# Patient Record
Sex: Male | Born: 1935 | Race: Black or African American | Hispanic: No
Health system: Southern US, Community
[De-identification: ages and names within clinical notes are randomized; demographics above are authoritative.]

## PROBLEM LIST (undated history)

## (undated) DIAGNOSIS — F32A Depression, unspecified: Secondary | ICD-10-CM

## (undated) DIAGNOSIS — F329 Major depressive disorder, single episode, unspecified: Secondary | ICD-10-CM

## (undated) DIAGNOSIS — E7800 Pure hypercholesterolemia, unspecified: Secondary | ICD-10-CM

## (undated) DIAGNOSIS — E119 Type 2 diabetes mellitus without complications: Secondary | ICD-10-CM

## (undated) DIAGNOSIS — F0390 Unspecified dementia without behavioral disturbance: Secondary | ICD-10-CM

## (undated) DIAGNOSIS — I10 Essential (primary) hypertension: Secondary | ICD-10-CM

## (undated) DIAGNOSIS — I4891 Unspecified atrial fibrillation: Secondary | ICD-10-CM

## (undated) DIAGNOSIS — Z8673 Personal history of transient ischemic attack (TIA), and cerebral infarction without residual deficits: Secondary | ICD-10-CM

## (undated) DIAGNOSIS — I639 Cerebral infarction, unspecified: Secondary | ICD-10-CM

## (undated) HISTORY — DX: Essential (primary) hypertension: I10

## (undated) HISTORY — DX: Unspecified atrial fibrillation: I48.91

## (undated) HISTORY — DX: Pure hypercholesterolemia, unspecified: E78.00

## (undated) HISTORY — DX: Depression, unspecified: F32.A

## (undated) HISTORY — DX: Type 2 diabetes mellitus without complications: E11.9

## (undated) HISTORY — DX: Unspecified dementia, unspecified severity, without behavioral disturbance, psychotic disturbance, mood disturbance, and anxiety: F03.90

## (undated) HISTORY — DX: Personal history of transient ischemic attack (TIA), and cerebral infarction without residual deficits: Z86.73

## (undated) HISTORY — DX: Major depressive disorder, single episode, unspecified: F32.9

## (undated) HISTORY — PX: PACEMAKER INSERTION: SHX728

## (undated) HISTORY — DX: Cerebral infarction, unspecified: I63.9

## (undated) HISTORY — PX: APPENDECTOMY: SHX54

---

## 2011-06-21 DIAGNOSIS — I495 Sick sinus syndrome: Secondary | ICD-10-CM | POA: Diagnosis not present

## 2011-06-21 DIAGNOSIS — Z950 Presence of cardiac pacemaker: Secondary | ICD-10-CM | POA: Diagnosis not present

## 2011-07-13 DIAGNOSIS — F519 Sleep disorder not due to a substance or known physiological condition, unspecified: Secondary | ICD-10-CM | POA: Diagnosis not present

## 2011-07-13 DIAGNOSIS — I739 Peripheral vascular disease, unspecified: Secondary | ICD-10-CM | POA: Diagnosis not present

## 2011-07-13 DIAGNOSIS — G619 Inflammatory polyneuropathy, unspecified: Secondary | ICD-10-CM | POA: Diagnosis not present

## 2011-07-13 DIAGNOSIS — E230 Hypopituitarism: Secondary | ICD-10-CM | POA: Diagnosis not present

## 2011-07-17 DIAGNOSIS — E1149 Type 2 diabetes mellitus with other diabetic neurological complication: Secondary | ICD-10-CM | POA: Diagnosis not present

## 2011-07-18 DIAGNOSIS — I495 Sick sinus syndrome: Secondary | ICD-10-CM | POA: Diagnosis not present

## 2011-07-18 DIAGNOSIS — Z950 Presence of cardiac pacemaker: Secondary | ICD-10-CM | POA: Diagnosis not present

## 2011-07-26 DIAGNOSIS — N19 Unspecified kidney failure: Secondary | ICD-10-CM | POA: Diagnosis not present

## 2011-07-26 DIAGNOSIS — E119 Type 2 diabetes mellitus without complications: Secondary | ICD-10-CM | POA: Diagnosis not present

## 2011-07-27 DIAGNOSIS — F519 Sleep disorder not due to a substance or known physiological condition, unspecified: Secondary | ICD-10-CM | POA: Diagnosis not present

## 2011-07-27 DIAGNOSIS — G2581 Restless legs syndrome: Secondary | ICD-10-CM | POA: Diagnosis not present

## 2011-07-31 DIAGNOSIS — B9789 Other viral agents as the cause of diseases classified elsewhere: Secondary | ICD-10-CM | POA: Diagnosis not present

## 2011-08-07 DIAGNOSIS — B9789 Other viral agents as the cause of diseases classified elsewhere: Secondary | ICD-10-CM | POA: Diagnosis not present

## 2011-08-15 DIAGNOSIS — Z950 Presence of cardiac pacemaker: Secondary | ICD-10-CM | POA: Diagnosis not present

## 2011-08-15 DIAGNOSIS — I495 Sick sinus syndrome: Secondary | ICD-10-CM | POA: Diagnosis not present

## 2011-08-17 DIAGNOSIS — G622 Polyneuropathy due to other toxic agents: Secondary | ICD-10-CM | POA: Diagnosis not present

## 2011-08-17 DIAGNOSIS — I739 Peripheral vascular disease, unspecified: Secondary | ICD-10-CM | POA: Diagnosis not present

## 2011-08-17 DIAGNOSIS — E230 Hypopituitarism: Secondary | ICD-10-CM | POA: Diagnosis not present

## 2011-08-17 DIAGNOSIS — G619 Inflammatory polyneuropathy, unspecified: Secondary | ICD-10-CM | POA: Diagnosis not present

## 2011-08-17 DIAGNOSIS — F519 Sleep disorder not due to a substance or known physiological condition, unspecified: Secondary | ICD-10-CM | POA: Diagnosis not present

## 2011-08-19 DIAGNOSIS — N19 Unspecified kidney failure: Secondary | ICD-10-CM | POA: Diagnosis not present

## 2011-08-21 DIAGNOSIS — B9789 Other viral agents as the cause of diseases classified elsewhere: Secondary | ICD-10-CM | POA: Diagnosis not present

## 2011-08-22 DIAGNOSIS — F519 Sleep disorder not due to a substance or known physiological condition, unspecified: Secondary | ICD-10-CM | POA: Diagnosis not present

## 2011-08-22 DIAGNOSIS — G2581 Restless legs syndrome: Secondary | ICD-10-CM | POA: Diagnosis not present

## 2011-08-29 DIAGNOSIS — I079 Rheumatic tricuspid valve disease, unspecified: Secondary | ICD-10-CM | POA: Diagnosis not present

## 2011-08-29 DIAGNOSIS — I499 Cardiac arrhythmia, unspecified: Secondary | ICD-10-CM | POA: Diagnosis not present

## 2011-08-29 DIAGNOSIS — R011 Cardiac murmur, unspecified: Secondary | ICD-10-CM | POA: Diagnosis not present

## 2011-09-01 DIAGNOSIS — B9789 Other viral agents as the cause of diseases classified elsewhere: Secondary | ICD-10-CM | POA: Diagnosis not present

## 2011-09-04 DIAGNOSIS — B9689 Other specified bacterial agents as the cause of diseases classified elsewhere: Secondary | ICD-10-CM | POA: Diagnosis not present

## 2011-09-04 DIAGNOSIS — N390 Urinary tract infection, site not specified: Secondary | ICD-10-CM | POA: Diagnosis not present

## 2011-09-04 DIAGNOSIS — A499 Bacterial infection, unspecified: Secondary | ICD-10-CM | POA: Diagnosis not present

## 2011-09-12 DIAGNOSIS — I495 Sick sinus syndrome: Secondary | ICD-10-CM | POA: Diagnosis not present

## 2011-09-12 DIAGNOSIS — Z950 Presence of cardiac pacemaker: Secondary | ICD-10-CM | POA: Diagnosis not present

## 2011-09-18 DIAGNOSIS — E1149 Type 2 diabetes mellitus with other diabetic neurological complication: Secondary | ICD-10-CM | POA: Diagnosis not present

## 2011-09-20 DIAGNOSIS — G622 Polyneuropathy due to other toxic agents: Secondary | ICD-10-CM | POA: Diagnosis not present

## 2011-09-20 DIAGNOSIS — F519 Sleep disorder not due to a substance or known physiological condition, unspecified: Secondary | ICD-10-CM | POA: Diagnosis not present

## 2011-09-20 DIAGNOSIS — I739 Peripheral vascular disease, unspecified: Secondary | ICD-10-CM | POA: Diagnosis not present

## 2011-09-20 DIAGNOSIS — G619 Inflammatory polyneuropathy, unspecified: Secondary | ICD-10-CM | POA: Diagnosis not present

## 2011-09-20 DIAGNOSIS — E230 Hypopituitarism: Secondary | ICD-10-CM | POA: Diagnosis not present

## 2011-09-21 DIAGNOSIS — B9789 Other viral agents as the cause of diseases classified elsewhere: Secondary | ICD-10-CM | POA: Diagnosis not present

## 2011-09-27 DIAGNOSIS — B9789 Other viral agents as the cause of diseases classified elsewhere: Secondary | ICD-10-CM | POA: Diagnosis not present

## 2011-09-27 DIAGNOSIS — M5414 Radiculopathy, thoracic region: Secondary | ICD-10-CM | POA: Diagnosis not present

## 2011-10-10 DIAGNOSIS — I495 Sick sinus syndrome: Secondary | ICD-10-CM | POA: Diagnosis not present

## 2011-10-10 DIAGNOSIS — Z950 Presence of cardiac pacemaker: Secondary | ICD-10-CM | POA: Diagnosis not present

## 2011-10-15 DIAGNOSIS — R0789 Other chest pain: Secondary | ICD-10-CM | POA: Diagnosis not present

## 2011-10-15 DIAGNOSIS — J90 Pleural effusion, not elsewhere classified: Secondary | ICD-10-CM | POA: Diagnosis not present

## 2011-10-15 DIAGNOSIS — N289 Disorder of kidney and ureter, unspecified: Secondary | ICD-10-CM | POA: Diagnosis present

## 2011-10-15 DIAGNOSIS — N644 Mastodynia: Secondary | ICD-10-CM | POA: Diagnosis not present

## 2011-10-15 DIAGNOSIS — I82409 Acute embolism and thrombosis of unspecified deep veins of unspecified lower extremity: Secondary | ICD-10-CM | POA: Diagnosis not present

## 2011-10-15 DIAGNOSIS — E119 Type 2 diabetes mellitus without complications: Secondary | ICD-10-CM | POA: Diagnosis not present

## 2011-10-15 DIAGNOSIS — I10 Essential (primary) hypertension: Secondary | ICD-10-CM | POA: Diagnosis not present

## 2011-10-15 DIAGNOSIS — R0602 Shortness of breath: Secondary | ICD-10-CM | POA: Diagnosis not present

## 2011-10-15 DIAGNOSIS — I4891 Unspecified atrial fibrillation: Secondary | ICD-10-CM | POA: Diagnosis not present

## 2011-10-15 DIAGNOSIS — Z950 Presence of cardiac pacemaker: Secondary | ICD-10-CM | POA: Diagnosis not present

## 2011-10-15 DIAGNOSIS — L98499 Non-pressure chronic ulcer of skin of other sites with unspecified severity: Secondary | ICD-10-CM | POA: Diagnosis not present

## 2011-10-15 DIAGNOSIS — R079 Chest pain, unspecified: Secondary | ICD-10-CM | POA: Diagnosis not present

## 2011-10-15 DIAGNOSIS — R071 Chest pain on breathing: Secondary | ICD-10-CM | POA: Diagnosis not present

## 2011-10-15 DIAGNOSIS — R918 Other nonspecific abnormal finding of lung field: Secondary | ICD-10-CM | POA: Diagnosis not present

## 2011-10-15 DIAGNOSIS — E785 Hyperlipidemia, unspecified: Secondary | ICD-10-CM | POA: Diagnosis not present

## 2011-10-15 DIAGNOSIS — Z8679 Personal history of other diseases of the circulatory system: Secondary | ICD-10-CM | POA: Diagnosis not present

## 2011-10-19 DIAGNOSIS — I739 Peripheral vascular disease, unspecified: Secondary | ICD-10-CM | POA: Diagnosis not present

## 2011-10-19 DIAGNOSIS — F519 Sleep disorder not due to a substance or known physiological condition, unspecified: Secondary | ICD-10-CM | POA: Diagnosis not present

## 2011-10-19 DIAGNOSIS — E230 Hypopituitarism: Secondary | ICD-10-CM | POA: Diagnosis not present

## 2011-10-19 DIAGNOSIS — G622 Polyneuropathy due to other toxic agents: Secondary | ICD-10-CM | POA: Diagnosis not present

## 2011-10-19 DIAGNOSIS — G619 Inflammatory polyneuropathy, unspecified: Secondary | ICD-10-CM | POA: Diagnosis not present

## 2011-10-19 DIAGNOSIS — N398 Other specified disorders of urinary system: Secondary | ICD-10-CM | POA: Diagnosis not present

## 2011-10-20 DIAGNOSIS — N289 Disorder of kidney and ureter, unspecified: Secondary | ICD-10-CM | POA: Diagnosis not present

## 2011-10-20 DIAGNOSIS — E860 Dehydration: Secondary | ICD-10-CM | POA: Diagnosis not present

## 2011-10-23 DIAGNOSIS — G2581 Restless legs syndrome: Secondary | ICD-10-CM | POA: Diagnosis not present

## 2011-10-23 DIAGNOSIS — E230 Hypopituitarism: Secondary | ICD-10-CM | POA: Diagnosis not present

## 2011-10-23 DIAGNOSIS — G4700 Insomnia, unspecified: Secondary | ICD-10-CM | POA: Diagnosis not present

## 2011-10-23 DIAGNOSIS — F519 Sleep disorder not due to a substance or known physiological condition, unspecified: Secondary | ICD-10-CM | POA: Diagnosis not present

## 2011-10-24 DIAGNOSIS — I319 Disease of pericardium, unspecified: Secondary | ICD-10-CM | POA: Diagnosis not present

## 2011-10-27 DIAGNOSIS — B9789 Other viral agents as the cause of diseases classified elsewhere: Secondary | ICD-10-CM | POA: Diagnosis not present

## 2011-11-06 DIAGNOSIS — I739 Peripheral vascular disease, unspecified: Secondary | ICD-10-CM | POA: Diagnosis not present

## 2011-11-06 DIAGNOSIS — F519 Sleep disorder not due to a substance or known physiological condition, unspecified: Secondary | ICD-10-CM | POA: Diagnosis not present

## 2011-11-06 DIAGNOSIS — E230 Hypopituitarism: Secondary | ICD-10-CM | POA: Diagnosis not present

## 2011-11-06 DIAGNOSIS — G619 Inflammatory polyneuropathy, unspecified: Secondary | ICD-10-CM | POA: Diagnosis not present

## 2011-11-06 DIAGNOSIS — G622 Polyneuropathy due to other toxic agents: Secondary | ICD-10-CM | POA: Diagnosis not present

## 2011-11-15 DIAGNOSIS — B9789 Other viral agents as the cause of diseases classified elsewhere: Secondary | ICD-10-CM | POA: Diagnosis not present

## 2011-11-17 DIAGNOSIS — I495 Sick sinus syndrome: Secondary | ICD-10-CM | POA: Diagnosis not present

## 2011-11-17 DIAGNOSIS — Z950 Presence of cardiac pacemaker: Secondary | ICD-10-CM | POA: Diagnosis not present

## 2011-11-21 DIAGNOSIS — B9789 Other viral agents as the cause of diseases classified elsewhere: Secondary | ICD-10-CM | POA: Diagnosis not present

## 2011-11-27 DIAGNOSIS — D649 Anemia, unspecified: Secondary | ICD-10-CM | POA: Diagnosis not present

## 2011-11-27 DIAGNOSIS — N400 Enlarged prostate without lower urinary tract symptoms: Secondary | ICD-10-CM | POA: Diagnosis not present

## 2011-12-04 DIAGNOSIS — G619 Inflammatory polyneuropathy, unspecified: Secondary | ICD-10-CM | POA: Diagnosis not present

## 2011-12-04 DIAGNOSIS — G622 Polyneuropathy due to other toxic agents: Secondary | ICD-10-CM | POA: Diagnosis not present

## 2011-12-04 DIAGNOSIS — F519 Sleep disorder not due to a substance or known physiological condition, unspecified: Secondary | ICD-10-CM | POA: Diagnosis not present

## 2011-12-04 DIAGNOSIS — E230 Hypopituitarism: Secondary | ICD-10-CM | POA: Diagnosis not present

## 2011-12-04 DIAGNOSIS — I739 Peripheral vascular disease, unspecified: Secondary | ICD-10-CM | POA: Diagnosis not present

## 2011-12-05 DIAGNOSIS — B9789 Other viral agents as the cause of diseases classified elsewhere: Secondary | ICD-10-CM | POA: Diagnosis not present

## 2011-12-12 DIAGNOSIS — Z950 Presence of cardiac pacemaker: Secondary | ICD-10-CM | POA: Diagnosis not present

## 2011-12-12 DIAGNOSIS — I495 Sick sinus syndrome: Secondary | ICD-10-CM | POA: Diagnosis not present

## 2011-12-20 DIAGNOSIS — B9789 Other viral agents as the cause of diseases classified elsewhere: Secondary | ICD-10-CM | POA: Diagnosis not present

## 2012-01-04 DIAGNOSIS — I129 Hypertensive chronic kidney disease with stage 1 through stage 4 chronic kidney disease, or unspecified chronic kidney disease: Secondary | ICD-10-CM | POA: Diagnosis not present

## 2012-01-04 DIAGNOSIS — G6189 Other inflammatory polyneuropathies: Secondary | ICD-10-CM | POA: Diagnosis not present

## 2012-01-04 DIAGNOSIS — B9789 Other viral agents as the cause of diseases classified elsewhere: Secondary | ICD-10-CM | POA: Diagnosis not present

## 2012-01-04 DIAGNOSIS — T5694XA Toxic effect of unspecified metal, undetermined, initial encounter: Secondary | ICD-10-CM | POA: Diagnosis not present

## 2012-01-04 DIAGNOSIS — G622 Polyneuropathy due to other toxic agents: Secondary | ICD-10-CM | POA: Diagnosis not present

## 2012-01-09 DIAGNOSIS — Z950 Presence of cardiac pacemaker: Secondary | ICD-10-CM | POA: Diagnosis not present

## 2012-01-09 DIAGNOSIS — I495 Sick sinus syndrome: Secondary | ICD-10-CM | POA: Diagnosis not present

## 2012-01-22 DIAGNOSIS — E1149 Type 2 diabetes mellitus with other diabetic neurological complication: Secondary | ICD-10-CM | POA: Diagnosis not present

## 2012-02-06 DIAGNOSIS — Z950 Presence of cardiac pacemaker: Secondary | ICD-10-CM | POA: Diagnosis not present

## 2012-02-06 DIAGNOSIS — I495 Sick sinus syndrome: Secondary | ICD-10-CM | POA: Diagnosis not present

## 2012-02-09 DIAGNOSIS — B9789 Other viral agents as the cause of diseases classified elsewhere: Secondary | ICD-10-CM | POA: Diagnosis not present

## 2012-03-05 DIAGNOSIS — I495 Sick sinus syndrome: Secondary | ICD-10-CM | POA: Diagnosis not present

## 2012-04-02 DIAGNOSIS — I495 Sick sinus syndrome: Secondary | ICD-10-CM | POA: Diagnosis not present

## 2012-05-02 DIAGNOSIS — B9789 Other viral agents as the cause of diseases classified elsewhere: Secondary | ICD-10-CM | POA: Diagnosis not present

## 2012-05-07 DIAGNOSIS — I495 Sick sinus syndrome: Secondary | ICD-10-CM | POA: Diagnosis not present

## 2012-05-07 DIAGNOSIS — Z950 Presence of cardiac pacemaker: Secondary | ICD-10-CM | POA: Diagnosis not present

## 2012-05-08 DIAGNOSIS — B9789 Other viral agents as the cause of diseases classified elsewhere: Secondary | ICD-10-CM | POA: Diagnosis not present

## 2012-05-09 DIAGNOSIS — E1149 Type 2 diabetes mellitus with other diabetic neurological complication: Secondary | ICD-10-CM | POA: Diagnosis not present

## 2012-05-21 DIAGNOSIS — IMO0002 Reserved for concepts with insufficient information to code with codable children: Secondary | ICD-10-CM | POA: Diagnosis not present

## 2012-05-21 DIAGNOSIS — G4730 Sleep apnea, unspecified: Secondary | ICD-10-CM | POA: Diagnosis not present

## 2012-05-21 DIAGNOSIS — M531 Cervicobrachial syndrome: Secondary | ICD-10-CM | POA: Diagnosis not present

## 2012-05-22 DIAGNOSIS — B9789 Other viral agents as the cause of diseases classified elsewhere: Secondary | ICD-10-CM | POA: Diagnosis not present

## 2012-05-22 DIAGNOSIS — C61 Malignant neoplasm of prostate: Secondary | ICD-10-CM | POA: Diagnosis not present

## 2012-05-22 DIAGNOSIS — K909 Intestinal malabsorption, unspecified: Secondary | ICD-10-CM | POA: Diagnosis not present

## 2012-05-22 DIAGNOSIS — D649 Anemia, unspecified: Secondary | ICD-10-CM | POA: Diagnosis not present

## 2012-05-22 DIAGNOSIS — E785 Hyperlipidemia, unspecified: Secondary | ICD-10-CM | POA: Diagnosis not present

## 2012-05-22 DIAGNOSIS — G548 Other nerve root and plexus disorders: Secondary | ICD-10-CM | POA: Diagnosis not present

## 2012-05-22 DIAGNOSIS — E230 Hypopituitarism: Secondary | ICD-10-CM | POA: Diagnosis not present

## 2012-05-31 DIAGNOSIS — Z950 Presence of cardiac pacemaker: Secondary | ICD-10-CM | POA: Diagnosis not present

## 2012-05-31 DIAGNOSIS — I4891 Unspecified atrial fibrillation: Secondary | ICD-10-CM | POA: Diagnosis not present

## 2012-05-31 DIAGNOSIS — I4892 Unspecified atrial flutter: Secondary | ICD-10-CM | POA: Diagnosis not present

## 2012-05-31 DIAGNOSIS — R002 Palpitations: Secondary | ICD-10-CM | POA: Diagnosis not present

## 2012-06-05 DIAGNOSIS — IMO0002 Reserved for concepts with insufficient information to code with codable children: Secondary | ICD-10-CM | POA: Diagnosis not present

## 2012-06-05 DIAGNOSIS — M531 Cervicobrachial syndrome: Secondary | ICD-10-CM | POA: Diagnosis not present

## 2012-06-05 DIAGNOSIS — B9789 Other viral agents as the cause of diseases classified elsewhere: Secondary | ICD-10-CM | POA: Diagnosis not present

## 2012-06-05 DIAGNOSIS — G4730 Sleep apnea, unspecified: Secondary | ICD-10-CM | POA: Diagnosis not present

## 2012-06-05 DIAGNOSIS — F411 Generalized anxiety disorder: Secondary | ICD-10-CM | POA: Diagnosis not present

## 2012-06-06 DIAGNOSIS — G548 Other nerve root and plexus disorders: Secondary | ICD-10-CM | POA: Diagnosis not present

## 2012-06-06 DIAGNOSIS — I509 Heart failure, unspecified: Secondary | ICD-10-CM | POA: Diagnosis not present

## 2012-06-10 DIAGNOSIS — G250 Essential tremor: Secondary | ICD-10-CM | POA: Diagnosis not present

## 2012-06-10 DIAGNOSIS — G4730 Sleep apnea, unspecified: Secondary | ICD-10-CM | POA: Diagnosis not present

## 2012-06-10 DIAGNOSIS — IMO0001 Reserved for inherently not codable concepts without codable children: Secondary | ICD-10-CM | POA: Diagnosis not present

## 2012-06-10 DIAGNOSIS — G548 Other nerve root and plexus disorders: Secondary | ICD-10-CM | POA: Diagnosis not present

## 2012-06-10 DIAGNOSIS — G2581 Restless legs syndrome: Secondary | ICD-10-CM | POA: Diagnosis not present

## 2012-06-10 DIAGNOSIS — G252 Other specified forms of tremor: Secondary | ICD-10-CM | POA: Diagnosis not present

## 2012-06-11 DIAGNOSIS — I110 Hypertensive heart disease with heart failure: Secondary | ICD-10-CM | POA: Diagnosis not present

## 2012-06-11 DIAGNOSIS — I4460 Unspecified fascicular block: Secondary | ICD-10-CM | POA: Diagnosis not present

## 2012-06-11 DIAGNOSIS — I4892 Unspecified atrial flutter: Secondary | ICD-10-CM | POA: Diagnosis not present

## 2012-06-11 DIAGNOSIS — I4891 Unspecified atrial fibrillation: Secondary | ICD-10-CM | POA: Diagnosis not present

## 2012-06-11 DIAGNOSIS — R0602 Shortness of breath: Secondary | ICD-10-CM | POA: Diagnosis not present

## 2012-06-11 DIAGNOSIS — I10 Essential (primary) hypertension: Secondary | ICD-10-CM | POA: Diagnosis not present

## 2012-06-11 DIAGNOSIS — I509 Heart failure, unspecified: Secondary | ICD-10-CM | POA: Diagnosis not present

## 2012-06-17 DIAGNOSIS — B9789 Other viral agents as the cause of diseases classified elsewhere: Secondary | ICD-10-CM | POA: Diagnosis not present

## 2012-06-18 DIAGNOSIS — M5430 Sciatica, unspecified side: Secondary | ICD-10-CM | POA: Diagnosis not present

## 2012-06-20 DIAGNOSIS — I10 Essential (primary) hypertension: Secondary | ICD-10-CM | POA: Diagnosis not present

## 2012-06-20 DIAGNOSIS — I4891 Unspecified atrial fibrillation: Secondary | ICD-10-CM | POA: Diagnosis not present

## 2012-06-20 DIAGNOSIS — I495 Sick sinus syndrome: Secondary | ICD-10-CM | POA: Diagnosis not present

## 2012-06-20 DIAGNOSIS — Z950 Presence of cardiac pacemaker: Secondary | ICD-10-CM | POA: Diagnosis not present

## 2012-06-25 DIAGNOSIS — I495 Sick sinus syndrome: Secondary | ICD-10-CM | POA: Diagnosis not present

## 2012-06-27 DIAGNOSIS — B9789 Other viral agents as the cause of diseases classified elsewhere: Secondary | ICD-10-CM | POA: Diagnosis not present

## 2012-06-28 DIAGNOSIS — M5430 Sciatica, unspecified side: Secondary | ICD-10-CM | POA: Diagnosis not present

## 2012-07-03 DIAGNOSIS — B9789 Other viral agents as the cause of diseases classified elsewhere: Secondary | ICD-10-CM | POA: Diagnosis not present

## 2012-07-03 DIAGNOSIS — G548 Other nerve root and plexus disorders: Secondary | ICD-10-CM | POA: Diagnosis not present

## 2012-07-04 DIAGNOSIS — I499 Cardiac arrhythmia, unspecified: Secondary | ICD-10-CM | POA: Diagnosis not present

## 2012-07-04 DIAGNOSIS — I10 Essential (primary) hypertension: Secondary | ICD-10-CM | POA: Diagnosis not present

## 2012-07-04 DIAGNOSIS — I4891 Unspecified atrial fibrillation: Secondary | ICD-10-CM | POA: Diagnosis not present

## 2012-07-11 DIAGNOSIS — E1149 Type 2 diabetes mellitus with other diabetic neurological complication: Secondary | ICD-10-CM | POA: Diagnosis not present

## 2012-07-17 DIAGNOSIS — B9789 Other viral agents as the cause of diseases classified elsewhere: Secondary | ICD-10-CM | POA: Diagnosis not present

## 2012-07-18 DIAGNOSIS — M5430 Sciatica, unspecified side: Secondary | ICD-10-CM | POA: Diagnosis not present

## 2012-07-18 DIAGNOSIS — E782 Mixed hyperlipidemia: Secondary | ICD-10-CM | POA: Diagnosis not present

## 2012-07-18 DIAGNOSIS — Z950 Presence of cardiac pacemaker: Secondary | ICD-10-CM | POA: Diagnosis not present

## 2012-07-18 DIAGNOSIS — I10 Essential (primary) hypertension: Secondary | ICD-10-CM | POA: Diagnosis not present

## 2012-07-18 DIAGNOSIS — I4891 Unspecified atrial fibrillation: Secondary | ICD-10-CM | POA: Diagnosis not present

## 2012-07-23 DIAGNOSIS — I495 Sick sinus syndrome: Secondary | ICD-10-CM | POA: Diagnosis not present

## 2012-08-15 DIAGNOSIS — M5430 Sciatica, unspecified side: Secondary | ICD-10-CM | POA: Diagnosis not present

## 2012-08-16 DIAGNOSIS — B9789 Other viral agents as the cause of diseases classified elsewhere: Secondary | ICD-10-CM | POA: Diagnosis not present

## 2012-08-19 DIAGNOSIS — G619 Inflammatory polyneuropathy, unspecified: Secondary | ICD-10-CM | POA: Diagnosis not present

## 2012-08-19 DIAGNOSIS — F519 Sleep disorder not due to a substance or known physiological condition, unspecified: Secondary | ICD-10-CM | POA: Diagnosis not present

## 2012-08-19 DIAGNOSIS — E230 Hypopituitarism: Secondary | ICD-10-CM | POA: Diagnosis not present

## 2012-08-19 DIAGNOSIS — I739 Peripheral vascular disease, unspecified: Secondary | ICD-10-CM | POA: Diagnosis not present

## 2012-08-19 DIAGNOSIS — G622 Polyneuropathy due to other toxic agents: Secondary | ICD-10-CM | POA: Diagnosis not present

## 2012-08-20 DIAGNOSIS — I495 Sick sinus syndrome: Secondary | ICD-10-CM | POA: Diagnosis not present

## 2012-08-21 DIAGNOSIS — M5430 Sciatica, unspecified side: Secondary | ICD-10-CM | POA: Diagnosis not present

## 2012-08-22 DIAGNOSIS — B9789 Other viral agents as the cause of diseases classified elsewhere: Secondary | ICD-10-CM | POA: Diagnosis not present

## 2012-09-03 DIAGNOSIS — R5381 Other malaise: Secondary | ICD-10-CM | POA: Diagnosis present

## 2012-09-03 DIAGNOSIS — H5210 Myopia, unspecified eye: Secondary | ICD-10-CM | POA: Diagnosis not present

## 2012-09-03 DIAGNOSIS — I6789 Other cerebrovascular disease: Secondary | ICD-10-CM | POA: Diagnosis not present

## 2012-09-03 DIAGNOSIS — R7989 Other specified abnormal findings of blood chemistry: Secondary | ICD-10-CM | POA: Diagnosis present

## 2012-09-03 DIAGNOSIS — E785 Hyperlipidemia, unspecified: Secondary | ICD-10-CM | POA: Diagnosis not present

## 2012-09-03 DIAGNOSIS — H53469 Homonymous bilateral field defects, unspecified side: Secondary | ICD-10-CM | POA: Diagnosis present

## 2012-09-03 DIAGNOSIS — I452 Bifascicular block: Secondary | ICD-10-CM | POA: Diagnosis present

## 2012-09-03 DIAGNOSIS — I6340 Cerebral infarction due to embolism of unspecified cerebral artery: Secondary | ICD-10-CM | POA: Diagnosis present

## 2012-09-03 DIAGNOSIS — I6350 Cerebral infarction due to unspecified occlusion or stenosis of unspecified cerebral artery: Secondary | ICD-10-CM | POA: Diagnosis not present

## 2012-09-03 DIAGNOSIS — Z7982 Long term (current) use of aspirin: Secondary | ICD-10-CM | POA: Diagnosis not present

## 2012-09-03 DIAGNOSIS — E119 Type 2 diabetes mellitus without complications: Secondary | ICD-10-CM | POA: Diagnosis not present

## 2012-09-03 DIAGNOSIS — I658 Occlusion and stenosis of other precerebral arteries: Secondary | ICD-10-CM | POA: Diagnosis not present

## 2012-09-03 DIAGNOSIS — Z950 Presence of cardiac pacemaker: Secondary | ICD-10-CM | POA: Diagnosis not present

## 2012-09-03 DIAGNOSIS — R9431 Abnormal electrocardiogram [ECG] [EKG]: Secondary | ICD-10-CM | POA: Diagnosis not present

## 2012-09-03 DIAGNOSIS — D649 Anemia, unspecified: Secondary | ICD-10-CM | POA: Diagnosis present

## 2012-09-03 DIAGNOSIS — I4891 Unspecified atrial fibrillation: Secondary | ICD-10-CM | POA: Diagnosis not present

## 2012-09-03 DIAGNOSIS — I6529 Occlusion and stenosis of unspecified carotid artery: Secondary | ICD-10-CM | POA: Diagnosis not present

## 2012-09-03 DIAGNOSIS — I059 Rheumatic mitral valve disease, unspecified: Secondary | ICD-10-CM | POA: Diagnosis not present

## 2012-09-03 DIAGNOSIS — I672 Cerebral atherosclerosis: Secondary | ICD-10-CM | POA: Diagnosis not present

## 2012-09-03 DIAGNOSIS — Z87891 Personal history of nicotine dependence: Secondary | ICD-10-CM | POA: Diagnosis not present

## 2012-09-03 DIAGNOSIS — Z880 Allergy status to penicillin: Secondary | ICD-10-CM | POA: Diagnosis not present

## 2012-09-03 DIAGNOSIS — I10 Essential (primary) hypertension: Secondary | ICD-10-CM | POA: Diagnosis not present

## 2012-09-03 DIAGNOSIS — K922 Gastrointestinal hemorrhage, unspecified: Secondary | ICD-10-CM | POA: Diagnosis not present

## 2012-09-03 DIAGNOSIS — H524 Presbyopia: Secondary | ICD-10-CM | POA: Diagnosis not present

## 2012-09-03 DIAGNOSIS — R279 Unspecified lack of coordination: Secondary | ICD-10-CM | POA: Diagnosis not present

## 2012-09-03 DIAGNOSIS — I369 Nonrheumatic tricuspid valve disorder, unspecified: Secondary | ICD-10-CM | POA: Diagnosis not present

## 2012-09-10 DIAGNOSIS — Z950 Presence of cardiac pacemaker: Secondary | ICD-10-CM | POA: Diagnosis not present

## 2012-09-10 DIAGNOSIS — I6350 Cerebral infarction due to unspecified occlusion or stenosis of unspecified cerebral artery: Secondary | ICD-10-CM | POA: Diagnosis not present

## 2012-09-10 DIAGNOSIS — R5383 Other fatigue: Secondary | ICD-10-CM | POA: Diagnosis not present

## 2012-09-10 DIAGNOSIS — E119 Type 2 diabetes mellitus without complications: Secondary | ICD-10-CM | POA: Diagnosis not present

## 2012-09-10 DIAGNOSIS — I69998 Other sequelae following unspecified cerebrovascular disease: Secondary | ICD-10-CM | POA: Diagnosis not present

## 2012-09-10 DIAGNOSIS — H53469 Homonymous bilateral field defects, unspecified side: Secondary | ICD-10-CM | POA: Diagnosis present

## 2012-09-10 DIAGNOSIS — R262 Difficulty in walking, not elsewhere classified: Secondary | ICD-10-CM | POA: Diagnosis not present

## 2012-09-10 DIAGNOSIS — H539 Unspecified visual disturbance: Secondary | ICD-10-CM | POA: Diagnosis not present

## 2012-09-10 DIAGNOSIS — G3184 Mild cognitive impairment, so stated: Secondary | ICD-10-CM | POA: Diagnosis not present

## 2012-09-10 DIAGNOSIS — I119 Hypertensive heart disease without heart failure: Secondary | ICD-10-CM | POA: Diagnosis present

## 2012-09-10 DIAGNOSIS — I10 Essential (primary) hypertension: Secondary | ICD-10-CM | POA: Diagnosis not present

## 2012-09-10 DIAGNOSIS — I4891 Unspecified atrial fibrillation: Secondary | ICD-10-CM | POA: Diagnosis not present

## 2012-09-10 DIAGNOSIS — E7800 Pure hypercholesterolemia, unspecified: Secondary | ICD-10-CM | POA: Diagnosis present

## 2012-09-10 DIAGNOSIS — R5381 Other malaise: Secondary | ICD-10-CM | POA: Diagnosis not present

## 2012-09-10 DIAGNOSIS — I519 Heart disease, unspecified: Secondary | ICD-10-CM | POA: Diagnosis not present

## 2012-09-10 DIAGNOSIS — Z5189 Encounter for other specified aftercare: Secondary | ICD-10-CM | POA: Diagnosis not present

## 2012-09-10 DIAGNOSIS — I69959 Hemiplegia and hemiparesis following unspecified cerebrovascular disease affecting unspecified side: Secondary | ICD-10-CM | POA: Diagnosis not present

## 2012-09-10 DIAGNOSIS — R279 Unspecified lack of coordination: Secondary | ICD-10-CM | POA: Diagnosis not present

## 2012-09-18 DIAGNOSIS — E669 Obesity, unspecified: Secondary | ICD-10-CM | POA: Diagnosis present

## 2012-09-18 DIAGNOSIS — D6869 Other thrombophilia: Secondary | ICD-10-CM | POA: Diagnosis not present

## 2012-09-18 DIAGNOSIS — G9340 Encephalopathy, unspecified: Secondary | ICD-10-CM | POA: Diagnosis not present

## 2012-09-18 DIAGNOSIS — R059 Cough, unspecified: Secondary | ICD-10-CM | POA: Diagnosis not present

## 2012-09-18 DIAGNOSIS — I672 Cerebral atherosclerosis: Secondary | ICD-10-CM | POA: Diagnosis present

## 2012-09-18 DIAGNOSIS — I119 Hypertensive heart disease without heart failure: Secondary | ICD-10-CM | POA: Diagnosis not present

## 2012-09-18 DIAGNOSIS — R269 Unspecified abnormalities of gait and mobility: Secondary | ICD-10-CM | POA: Diagnosis not present

## 2012-09-18 DIAGNOSIS — I519 Heart disease, unspecified: Secondary | ICD-10-CM | POA: Diagnosis not present

## 2012-09-18 DIAGNOSIS — R5381 Other malaise: Secondary | ICD-10-CM | POA: Diagnosis not present

## 2012-09-18 DIAGNOSIS — Z950 Presence of cardiac pacemaker: Secondary | ICD-10-CM | POA: Diagnosis not present

## 2012-09-18 DIAGNOSIS — D649 Anemia, unspecified: Secondary | ICD-10-CM | POA: Diagnosis not present

## 2012-09-18 DIAGNOSIS — R05 Cough: Secondary | ICD-10-CM | POA: Diagnosis not present

## 2012-09-18 DIAGNOSIS — E785 Hyperlipidemia, unspecified: Secondary | ICD-10-CM | POA: Diagnosis present

## 2012-09-18 DIAGNOSIS — Z7901 Long term (current) use of anticoagulants: Secondary | ICD-10-CM | POA: Diagnosis not present

## 2012-09-18 DIAGNOSIS — I4891 Unspecified atrial fibrillation: Secondary | ICD-10-CM | POA: Diagnosis not present

## 2012-09-18 DIAGNOSIS — R262 Difficulty in walking, not elsewhere classified: Secondary | ICD-10-CM | POA: Diagnosis not present

## 2012-09-18 DIAGNOSIS — E119 Type 2 diabetes mellitus without complications: Secondary | ICD-10-CM | POA: Diagnosis not present

## 2012-09-18 DIAGNOSIS — R29898 Other symptoms and signs involving the musculoskeletal system: Secondary | ICD-10-CM | POA: Diagnosis present

## 2012-09-18 DIAGNOSIS — I6322 Cerebral infarction due to unspecified occlusion or stenosis of basilar arteries: Secondary | ICD-10-CM | POA: Diagnosis not present

## 2012-09-18 DIAGNOSIS — Z7982 Long term (current) use of aspirin: Secondary | ICD-10-CM | POA: Diagnosis not present

## 2012-09-18 DIAGNOSIS — H5340 Unspecified visual field defects: Secondary | ICD-10-CM | POA: Diagnosis present

## 2012-09-18 DIAGNOSIS — I10 Essential (primary) hypertension: Secondary | ICD-10-CM | POA: Diagnosis not present

## 2012-09-18 DIAGNOSIS — Z87891 Personal history of nicotine dependence: Secondary | ICD-10-CM | POA: Diagnosis not present

## 2012-09-18 DIAGNOSIS — R079 Chest pain, unspecified: Secondary | ICD-10-CM | POA: Diagnosis not present

## 2012-09-18 DIAGNOSIS — H53469 Homonymous bilateral field defects, unspecified side: Secondary | ICD-10-CM | POA: Diagnosis not present

## 2012-09-18 DIAGNOSIS — R4182 Altered mental status, unspecified: Secondary | ICD-10-CM | POA: Diagnosis not present

## 2012-09-18 DIAGNOSIS — R279 Unspecified lack of coordination: Secondary | ICD-10-CM | POA: Diagnosis present

## 2012-09-18 DIAGNOSIS — Z5189 Encounter for other specified aftercare: Secondary | ICD-10-CM | POA: Diagnosis not present

## 2012-09-18 DIAGNOSIS — I6350 Cerebral infarction due to unspecified occlusion or stenosis of unspecified cerebral artery: Secondary | ICD-10-CM | POA: Diagnosis not present

## 2012-09-18 DIAGNOSIS — IMO0002 Reserved for concepts with insufficient information to code with codable children: Secondary | ICD-10-CM | POA: Diagnosis not present

## 2012-09-18 DIAGNOSIS — R791 Abnormal coagulation profile: Secondary | ICD-10-CM | POA: Diagnosis not present

## 2012-09-18 DIAGNOSIS — I69959 Hemiplegia and hemiparesis following unspecified cerebrovascular disease affecting unspecified side: Secondary | ICD-10-CM | POA: Diagnosis not present

## 2012-09-25 DIAGNOSIS — I10 Essential (primary) hypertension: Secondary | ICD-10-CM | POA: Diagnosis present

## 2012-09-25 DIAGNOSIS — Z7901 Long term (current) use of anticoagulants: Secondary | ICD-10-CM | POA: Diagnosis not present

## 2012-09-25 DIAGNOSIS — R5383 Other fatigue: Secondary | ICD-10-CM | POA: Diagnosis present

## 2012-09-25 DIAGNOSIS — R269 Unspecified abnormalities of gait and mobility: Secondary | ICD-10-CM | POA: Diagnosis present

## 2012-09-25 DIAGNOSIS — R5381 Other malaise: Secondary | ICD-10-CM | POA: Diagnosis present

## 2012-09-25 DIAGNOSIS — R262 Difficulty in walking, not elsewhere classified: Secondary | ICD-10-CM | POA: Diagnosis not present

## 2012-09-25 DIAGNOSIS — H25099 Other age-related incipient cataract, unspecified eye: Secondary | ICD-10-CM | POA: Diagnosis not present

## 2012-09-25 DIAGNOSIS — I69993 Ataxia following unspecified cerebrovascular disease: Secondary | ICD-10-CM | POA: Diagnosis not present

## 2012-09-25 DIAGNOSIS — I4891 Unspecified atrial fibrillation: Secondary | ICD-10-CM | POA: Diagnosis not present

## 2012-09-25 DIAGNOSIS — I6350 Cerebral infarction due to unspecified occlusion or stenosis of unspecified cerebral artery: Secondary | ICD-10-CM | POA: Diagnosis not present

## 2012-09-25 DIAGNOSIS — D649 Anemia, unspecified: Secondary | ICD-10-CM | POA: Diagnosis present

## 2012-09-25 DIAGNOSIS — G3184 Mild cognitive impairment, so stated: Secondary | ICD-10-CM | POA: Diagnosis not present

## 2012-09-25 DIAGNOSIS — H5340 Unspecified visual field defects: Secondary | ICD-10-CM | POA: Diagnosis not present

## 2012-09-25 DIAGNOSIS — I69998 Other sequelae following unspecified cerebrovascular disease: Secondary | ICD-10-CM | POA: Diagnosis not present

## 2012-09-25 DIAGNOSIS — Z5189 Encounter for other specified aftercare: Secondary | ICD-10-CM | POA: Diagnosis not present

## 2012-09-25 DIAGNOSIS — E119 Type 2 diabetes mellitus without complications: Secondary | ICD-10-CM | POA: Diagnosis not present

## 2012-09-25 DIAGNOSIS — F332 Major depressive disorder, recurrent severe without psychotic features: Secondary | ICD-10-CM | POA: Diagnosis not present

## 2012-09-25 DIAGNOSIS — Z950 Presence of cardiac pacemaker: Secondary | ICD-10-CM | POA: Diagnosis not present

## 2012-10-15 DIAGNOSIS — K625 Hemorrhage of anus and rectum: Secondary | ICD-10-CM | POA: Diagnosis not present

## 2012-10-18 DIAGNOSIS — F329 Major depressive disorder, single episode, unspecified: Secondary | ICD-10-CM | POA: Diagnosis not present

## 2012-10-18 DIAGNOSIS — Z8673 Personal history of transient ischemic attack (TIA), and cerebral infarction without residual deficits: Secondary | ICD-10-CM | POA: Diagnosis not present

## 2012-10-18 DIAGNOSIS — I10 Essential (primary) hypertension: Secondary | ICD-10-CM | POA: Diagnosis not present

## 2012-10-18 DIAGNOSIS — I4891 Unspecified atrial fibrillation: Secondary | ICD-10-CM | POA: Diagnosis not present

## 2012-10-18 DIAGNOSIS — Z7901 Long term (current) use of anticoagulants: Secondary | ICD-10-CM | POA: Diagnosis not present

## 2012-10-18 DIAGNOSIS — K5900 Constipation, unspecified: Secondary | ICD-10-CM | POA: Diagnosis not present

## 2012-10-18 DIAGNOSIS — E119 Type 2 diabetes mellitus without complications: Secondary | ICD-10-CM | POA: Diagnosis not present

## 2012-10-21 DIAGNOSIS — H5410 Blindness, one eye, low vision other eye, unspecified eyes: Secondary | ICD-10-CM | POA: Diagnosis not present

## 2012-10-21 DIAGNOSIS — I69998 Other sequelae following unspecified cerebrovascular disease: Secondary | ICD-10-CM | POA: Diagnosis not present

## 2012-10-21 DIAGNOSIS — I69993 Ataxia following unspecified cerebrovascular disease: Secondary | ICD-10-CM | POA: Diagnosis not present

## 2012-10-21 DIAGNOSIS — H539 Unspecified visual disturbance: Secondary | ICD-10-CM | POA: Diagnosis not present

## 2012-10-21 DIAGNOSIS — I69919 Unspecified symptoms and signs involving cognitive functions following unspecified cerebrovascular disease: Secondary | ICD-10-CM | POA: Diagnosis not present

## 2012-10-23 DIAGNOSIS — I69919 Unspecified symptoms and signs involving cognitive functions following unspecified cerebrovascular disease: Secondary | ICD-10-CM | POA: Diagnosis not present

## 2012-10-23 DIAGNOSIS — I69993 Ataxia following unspecified cerebrovascular disease: Secondary | ICD-10-CM | POA: Diagnosis not present

## 2012-10-24 DIAGNOSIS — I69993 Ataxia following unspecified cerebrovascular disease: Secondary | ICD-10-CM | POA: Diagnosis not present

## 2012-10-24 DIAGNOSIS — I69919 Unspecified symptoms and signs involving cognitive functions following unspecified cerebrovascular disease: Secondary | ICD-10-CM | POA: Diagnosis not present

## 2012-10-25 DIAGNOSIS — I69993 Ataxia following unspecified cerebrovascular disease: Secondary | ICD-10-CM | POA: Diagnosis not present

## 2012-10-25 DIAGNOSIS — I69919 Unspecified symptoms and signs involving cognitive functions following unspecified cerebrovascular disease: Secondary | ICD-10-CM | POA: Diagnosis not present

## 2012-10-29 DIAGNOSIS — I69993 Ataxia following unspecified cerebrovascular disease: Secondary | ICD-10-CM | POA: Diagnosis not present

## 2012-10-29 DIAGNOSIS — I69919 Unspecified symptoms and signs involving cognitive functions following unspecified cerebrovascular disease: Secondary | ICD-10-CM | POA: Diagnosis not present

## 2012-10-31 DIAGNOSIS — Z7901 Long term (current) use of anticoagulants: Secondary | ICD-10-CM | POA: Diagnosis not present

## 2012-10-31 DIAGNOSIS — Z8673 Personal history of transient ischemic attack (TIA), and cerebral infarction without residual deficits: Secondary | ICD-10-CM | POA: Diagnosis not present

## 2012-10-31 DIAGNOSIS — E785 Hyperlipidemia, unspecified: Secondary | ICD-10-CM | POA: Diagnosis not present

## 2012-10-31 DIAGNOSIS — F329 Major depressive disorder, single episode, unspecified: Secondary | ICD-10-CM | POA: Diagnosis not present

## 2012-10-31 DIAGNOSIS — K5900 Constipation, unspecified: Secondary | ICD-10-CM | POA: Diagnosis not present

## 2012-10-31 DIAGNOSIS — E119 Type 2 diabetes mellitus without complications: Secondary | ICD-10-CM | POA: Diagnosis not present

## 2012-10-31 DIAGNOSIS — I4891 Unspecified atrial fibrillation: Secondary | ICD-10-CM | POA: Diagnosis not present

## 2012-10-31 DIAGNOSIS — I10 Essential (primary) hypertension: Secondary | ICD-10-CM | POA: Diagnosis not present

## 2012-11-05 DIAGNOSIS — I69993 Ataxia following unspecified cerebrovascular disease: Secondary | ICD-10-CM | POA: Diagnosis not present

## 2012-11-05 DIAGNOSIS — I69919 Unspecified symptoms and signs involving cognitive functions following unspecified cerebrovascular disease: Secondary | ICD-10-CM | POA: Diagnosis not present

## 2012-11-06 ENCOUNTER — Encounter: Payer: Self-pay | Admitting: Neurology

## 2012-11-06 DIAGNOSIS — I4891 Unspecified atrial fibrillation: Secondary | ICD-10-CM

## 2012-11-06 DIAGNOSIS — F329 Major depressive disorder, single episode, unspecified: Secondary | ICD-10-CM | POA: Insufficient documentation

## 2012-11-06 DIAGNOSIS — E119 Type 2 diabetes mellitus without complications: Secondary | ICD-10-CM | POA: Diagnosis not present

## 2012-11-06 DIAGNOSIS — I10 Essential (primary) hypertension: Secondary | ICD-10-CM | POA: Insufficient documentation

## 2012-11-06 DIAGNOSIS — F32A Depression, unspecified: Secondary | ICD-10-CM | POA: Insufficient documentation

## 2012-11-07 ENCOUNTER — Encounter: Payer: Self-pay | Admitting: Neurology

## 2012-11-07 ENCOUNTER — Ambulatory Visit (INDEPENDENT_AMBULATORY_CARE_PROVIDER_SITE_OTHER): Payer: Medicare Other | Admitting: Neurology

## 2012-11-07 VITALS — BP 128/81 | HR 68 | Temp 96.3°F | Ht 68.0 in | Wt 175.0 lb

## 2012-11-07 DIAGNOSIS — F32A Depression, unspecified: Secondary | ICD-10-CM

## 2012-11-07 DIAGNOSIS — I4891 Unspecified atrial fibrillation: Secondary | ICD-10-CM

## 2012-11-07 DIAGNOSIS — F3289 Other specified depressive episodes: Secondary | ICD-10-CM | POA: Diagnosis not present

## 2012-11-07 DIAGNOSIS — I10 Essential (primary) hypertension: Secondary | ICD-10-CM | POA: Diagnosis not present

## 2012-11-07 DIAGNOSIS — I6350 Cerebral infarction due to unspecified occlusion or stenosis of unspecified cerebral artery: Secondary | ICD-10-CM

## 2012-11-07 DIAGNOSIS — I639 Cerebral infarction, unspecified: Secondary | ICD-10-CM

## 2012-11-07 DIAGNOSIS — R569 Unspecified convulsions: Secondary | ICD-10-CM | POA: Diagnosis not present

## 2012-11-07 DIAGNOSIS — F329 Major depressive disorder, single episode, unspecified: Secondary | ICD-10-CM | POA: Diagnosis not present

## 2012-11-07 NOTE — Progress Notes (Signed)
History of present illness Brent Dunn is a 77 years old right-handed African American male, accompanied by his sister referred by primary care doctor Marica Otter for evaluation of stroke, seizure,  He had a past medical history of diabetes, hypertension, depression, hyperlipidemia, atrial fibrillation, on chronic Coumadin treatment, he is a poor historian, his sister provides the majority of the history, he barely volunteer for information  He suffered a stroke in March 2014, was treated at [facility] [place], per referring notes, stroke involving left occipital, cerebellar region, he now moves down living with his brother, his 2 sisters checks on him every day,   Prior of stroke he lives alone at [facility], now he cannot be left alone, diffuse, not following commands, gait difficulty, difficulty seeing his right side, difficulty reading, He had a seizure following his stroke, taking Keppra 500 twice a day, there was no recurrent seizures since April 2014 he moved to [place]  He graduated from [employer] school, worked at a Musician job,   Review of [employer] of a complete 14 system review, the patient complains of only the following symptoms, and all other reviewed systems are negative.   Constitutional:  Weight loss Cardiovascular:  N/A Ear/Nose/Throat:  N/A Skin: N/A Eyes: Loss of vision Respiratory: N/A Gastroitestinal: Constipation    Hematology/Lymphatic:  N/A Endocrine:  Feeling hot feeling cold Musculoskeletal:N/A Allergy/Immunology: Runny nose Neurological: Memory loss Psychiatric:    Depression  PHYSICAL EXAMINATOINS:  Generalized: In no acute distress  Neck: Supple, no carotid bruits   Cardiac: Regular rate rhythm  Pulmonary: Clear to auscultation bilaterally  Musculoskeletal: No deformity  Neurological examination  Mentation: flat expression, has difficulty following 3 step commands. Right hemineglect, He has difficulty with naming, writing, reading,  Mini-Mental  Status Examination is 7 out of 30,  Cranial nerve II-XII: Pupils were equal round reactive to light extraocular movements were full,  Right visual field cut.  facial sensation and strength were normal. hearing was intact to finger rubbing bilaterally. Uvula tongue midline.  head turning and shoulder shrug and were normal and symmetric.Tongue protrusion into cheek strength was normal.  Motor: normal tone, bulk and strength.   Sensory: intact to light touch  Coordination: Normal finger to nose, heel-to-shin bilaterally there was no truncal ataxia  Gait: cautious, moderate stride. Romberg signs: Negative  Deep tendon reflexes: Brachioradialis 2/2, biceps 2/2, triceps 2/2, patellar 2/2, Achilles 2/2, plantar responses were flexor bilaterally.  Assessment and plan:  77 years old right-handed African American male,With past medical history of stroke, involving left occipital, cerebellum region, diabetes, atrial fibrillation, hypertension, hyperlipidemia, post stroke seizure, on examination, he has right and left confusion, right neglect, difficulty reading writing, naming, Mini-Mental Status Examination is only 7 out of 30,  1, continue Keppra, 500 mg twice a day, 2, continue Coumadin, 3, medical record from [facility] [place] hospital, 4 return to clinic in 2-3 months.

## 2012-11-08 DIAGNOSIS — I69993 Ataxia following unspecified cerebrovascular disease: Secondary | ICD-10-CM | POA: Diagnosis not present

## 2012-11-08 DIAGNOSIS — I69919 Unspecified symptoms and signs involving cognitive functions following unspecified cerebrovascular disease: Secondary | ICD-10-CM | POA: Diagnosis not present

## 2012-11-12 DIAGNOSIS — I69993 Ataxia following unspecified cerebrovascular disease: Secondary | ICD-10-CM | POA: Diagnosis not present

## 2012-11-12 DIAGNOSIS — I69919 Unspecified symptoms and signs involving cognitive functions following unspecified cerebrovascular disease: Secondary | ICD-10-CM | POA: Diagnosis not present

## 2012-11-13 DIAGNOSIS — K625 Hemorrhage of anus and rectum: Secondary | ICD-10-CM | POA: Diagnosis not present

## 2012-11-14 DIAGNOSIS — I69993 Ataxia following unspecified cerebrovascular disease: Secondary | ICD-10-CM | POA: Diagnosis not present

## 2012-11-14 DIAGNOSIS — I69919 Unspecified symptoms and signs involving cognitive functions following unspecified cerebrovascular disease: Secondary | ICD-10-CM | POA: Diagnosis not present

## 2012-11-15 ENCOUNTER — Other Ambulatory Visit: Payer: Self-pay | Admitting: Gastroenterology

## 2012-11-15 DIAGNOSIS — I69919 Unspecified symptoms and signs involving cognitive functions following unspecified cerebrovascular disease: Secondary | ICD-10-CM | POA: Diagnosis not present

## 2012-11-15 DIAGNOSIS — I69993 Ataxia following unspecified cerebrovascular disease: Secondary | ICD-10-CM | POA: Diagnosis not present

## 2012-11-15 DIAGNOSIS — K625 Hemorrhage of anus and rectum: Secondary | ICD-10-CM

## 2012-11-18 DIAGNOSIS — I10 Essential (primary) hypertension: Secondary | ICD-10-CM | POA: Diagnosis not present

## 2012-11-18 DIAGNOSIS — I4891 Unspecified atrial fibrillation: Secondary | ICD-10-CM | POA: Diagnosis not present

## 2012-11-18 DIAGNOSIS — Z950 Presence of cardiac pacemaker: Secondary | ICD-10-CM | POA: Diagnosis not present

## 2012-11-20 DIAGNOSIS — I69919 Unspecified symptoms and signs involving cognitive functions following unspecified cerebrovascular disease: Secondary | ICD-10-CM | POA: Diagnosis not present

## 2012-11-20 DIAGNOSIS — I69993 Ataxia following unspecified cerebrovascular disease: Secondary | ICD-10-CM | POA: Diagnosis not present

## 2012-11-21 DIAGNOSIS — I4891 Unspecified atrial fibrillation: Secondary | ICD-10-CM | POA: Diagnosis not present

## 2012-11-21 DIAGNOSIS — I69993 Ataxia following unspecified cerebrovascular disease: Secondary | ICD-10-CM | POA: Diagnosis not present

## 2012-11-21 DIAGNOSIS — I69919 Unspecified symptoms and signs involving cognitive functions following unspecified cerebrovascular disease: Secondary | ICD-10-CM | POA: Diagnosis not present

## 2012-11-21 DIAGNOSIS — Z950 Presence of cardiac pacemaker: Secondary | ICD-10-CM | POA: Diagnosis not present

## 2012-11-22 DIAGNOSIS — I69993 Ataxia following unspecified cerebrovascular disease: Secondary | ICD-10-CM | POA: Diagnosis not present

## 2012-11-22 DIAGNOSIS — I69919 Unspecified symptoms and signs involving cognitive functions following unspecified cerebrovascular disease: Secondary | ICD-10-CM | POA: Diagnosis not present

## 2012-11-26 DIAGNOSIS — I69919 Unspecified symptoms and signs involving cognitive functions following unspecified cerebrovascular disease: Secondary | ICD-10-CM | POA: Diagnosis not present

## 2012-11-26 DIAGNOSIS — I69993 Ataxia following unspecified cerebrovascular disease: Secondary | ICD-10-CM | POA: Diagnosis not present

## 2012-11-28 DIAGNOSIS — I69919 Unspecified symptoms and signs involving cognitive functions following unspecified cerebrovascular disease: Secondary | ICD-10-CM | POA: Diagnosis not present

## 2012-11-28 DIAGNOSIS — I69993 Ataxia following unspecified cerebrovascular disease: Secondary | ICD-10-CM | POA: Diagnosis not present

## 2012-11-29 DIAGNOSIS — I69993 Ataxia following unspecified cerebrovascular disease: Secondary | ICD-10-CM | POA: Diagnosis not present

## 2012-11-29 DIAGNOSIS — I69919 Unspecified symptoms and signs involving cognitive functions following unspecified cerebrovascular disease: Secondary | ICD-10-CM | POA: Diagnosis not present

## 2012-12-05 DIAGNOSIS — I69919 Unspecified symptoms and signs involving cognitive functions following unspecified cerebrovascular disease: Secondary | ICD-10-CM | POA: Diagnosis not present

## 2012-12-05 DIAGNOSIS — I69993 Ataxia following unspecified cerebrovascular disease: Secondary | ICD-10-CM | POA: Diagnosis not present

## 2012-12-06 DIAGNOSIS — I69919 Unspecified symptoms and signs involving cognitive functions following unspecified cerebrovascular disease: Secondary | ICD-10-CM | POA: Diagnosis not present

## 2012-12-06 DIAGNOSIS — I69993 Ataxia following unspecified cerebrovascular disease: Secondary | ICD-10-CM | POA: Diagnosis not present

## 2012-12-09 ENCOUNTER — Ambulatory Visit
Admission: RE | Admit: 2012-12-09 | Discharge: 2012-12-09 | Disposition: A | Discharge status: Home / Self Care | Payer: Medicare Other | Source: Ambulatory Visit | Attending: Gastroenterology | Admitting: Gastroenterology

## 2012-12-09 DIAGNOSIS — K625 Hemorrhage of anus and rectum: Secondary | ICD-10-CM | POA: Diagnosis not present

## 2012-12-17 DIAGNOSIS — E119 Type 2 diabetes mellitus without complications: Secondary | ICD-10-CM | POA: Diagnosis not present

## 2012-12-17 DIAGNOSIS — H2510 Age-related nuclear cataract, unspecified eye: Secondary | ICD-10-CM | POA: Diagnosis not present

## 2012-12-17 DIAGNOSIS — H53489 Generalized contraction of visual field, unspecified eye: Secondary | ICD-10-CM | POA: Diagnosis not present

## 2012-12-17 DIAGNOSIS — H539 Unspecified visual disturbance: Secondary | ICD-10-CM | POA: Diagnosis not present

## 2012-12-27 DIAGNOSIS — Z8673 Personal history of transient ischemic attack (TIA), and cerebral infarction without residual deficits: Secondary | ICD-10-CM | POA: Diagnosis not present

## 2012-12-27 DIAGNOSIS — Z7901 Long term (current) use of anticoagulants: Secondary | ICD-10-CM | POA: Diagnosis not present

## 2012-12-27 DIAGNOSIS — E785 Hyperlipidemia, unspecified: Secondary | ICD-10-CM | POA: Diagnosis not present

## 2012-12-27 DIAGNOSIS — E119 Type 2 diabetes mellitus without complications: Secondary | ICD-10-CM | POA: Diagnosis not present

## 2012-12-27 DIAGNOSIS — I10 Essential (primary) hypertension: Secondary | ICD-10-CM | POA: Diagnosis not present

## 2012-12-27 DIAGNOSIS — F329 Major depressive disorder, single episode, unspecified: Secondary | ICD-10-CM | POA: Diagnosis not present

## 2012-12-27 DIAGNOSIS — Z1331 Encounter for screening for depression: Secondary | ICD-10-CM | POA: Diagnosis not present

## 2012-12-27 DIAGNOSIS — M79609 Pain in unspecified limb: Secondary | ICD-10-CM | POA: Diagnosis not present

## 2012-12-27 DIAGNOSIS — I4891 Unspecified atrial fibrillation: Secondary | ICD-10-CM | POA: Diagnosis not present

## 2012-12-27 DIAGNOSIS — K5900 Constipation, unspecified: Secondary | ICD-10-CM | POA: Diagnosis not present

## 2013-01-06 DIAGNOSIS — Z7901 Long term (current) use of anticoagulants: Secondary | ICD-10-CM | POA: Diagnosis not present

## 2013-01-07 ENCOUNTER — Ambulatory Visit (INDEPENDENT_AMBULATORY_CARE_PROVIDER_SITE_OTHER): Payer: Medicare Other | Admitting: Neurology

## 2013-01-07 ENCOUNTER — Encounter: Payer: Self-pay | Admitting: Neurology

## 2013-01-07 VITALS — BP 145/90 | HR 63 | Ht 68.0 in | Wt 176.0 lb

## 2013-01-07 DIAGNOSIS — I6350 Cerebral infarction due to unspecified occlusion or stenosis of unspecified cerebral artery: Secondary | ICD-10-CM | POA: Diagnosis not present

## 2013-01-07 DIAGNOSIS — I6789 Other cerebrovascular disease: Secondary | ICD-10-CM

## 2013-01-07 DIAGNOSIS — F3289 Other specified depressive episodes: Secondary | ICD-10-CM

## 2013-01-07 DIAGNOSIS — I639 Cerebral infarction, unspecified: Secondary | ICD-10-CM

## 2013-01-07 DIAGNOSIS — I10 Essential (primary) hypertension: Secondary | ICD-10-CM | POA: Diagnosis not present

## 2013-01-07 DIAGNOSIS — F32A Depression, unspecified: Secondary | ICD-10-CM

## 2013-01-07 DIAGNOSIS — I4891 Unspecified atrial fibrillation: Secondary | ICD-10-CM | POA: Diagnosis not present

## 2013-01-07 DIAGNOSIS — R569 Unspecified convulsions: Secondary | ICD-10-CM

## 2013-01-07 DIAGNOSIS — F329 Major depressive disorder, single episode, unspecified: Secondary | ICD-10-CM | POA: Diagnosis not present

## 2013-01-07 NOTE — Progress Notes (Signed)
History of present illness [name] is a 77 years old right-handed African American male, accompanied by his sister referred by primary care doctor Marica Otter for evaluation of stroke, seizure,  He had a past medical history of diabetes, hypertension, depression, hyperlipidemia, atrial fibrillation, on chronic Coumadin treatment, he is a poor historian, his sister provides the majority of the history, he barely volunteer for information  He suffered a stroke in March 2014, was treated at [facility], per referring notes, stroke involving left occipital, cerebellar region, he now moves down living with his brother, his 2 sisters checks on him every day,   Prior of stroke he lives alone at [facility], now he cannot be left alone, diffuse, not following commands, gait difficulty, difficulty seeing his right side, difficulty reading, He had a seizure following his stroke, taking Keppra 500 twice a day, there was no recurrent seizures since April 2014 he moved to [place]  He graduated from [employer] school, worked at a Musician job,   UPDATE July 22nd 2014;  His sister  Review of Systems  Out of a complete 14 system review, the patient complains of only the following symptoms, and all other reviewed systems are negative.   Constitutional:  Weight loss Cardiovascular:  N/A Ear/Nose/Throat:  N/A Skin: N/A Eyes: Loss of vision Respiratory: N/A Gastroitestinal: Constipation    Hematology/Lymphatic:  N/A Endocrine:  Feeling hot feeling cold Musculoskeletal:N/A Allergy/Immunology: Runny nose Neurological: Memory loss Psychiatric:    Depression  PHYSICAL EXAMINATOINS:  Generalized: In no acute distress  Neck: Supple, no carotid bruits   Cardiac: Regular rate rhythm  Pulmonary: Clear to auscultation bilaterally  Musculoskeletal: No deformity  Neurological examination  Mentation: flat expression, has difficulty following 3 step commands. Right hemineglect, He has difficulty with  naming, writing, reading,  Mini-Mental Status Examination is 7 out of 30,  Cranial nerve II-XII: Pupils were equal round reactive to light extraocular movements were full,  Right visual field cut.  facial sensation and strength were normal. hearing was intact to finger rubbing bilaterally. Uvula tongue midline.  head turning and shoulder shrug and were normal and symmetric.Tongue protrusion into cheek strength was normal.  Motor: normal tone, bulk and strength.   Sensory: intact to light touch  Coordination: Normal finger to nose, heel-to-shin bilaterally there was no truncal ataxia  Gait: cautious, moderate stride. Romberg signs: Negative  Deep tendon reflexes: Brachioradialis 2/2, biceps 2/2, triceps 2/2, patellar 2/2, Achilles 2/2, plantar responses were flexor bilaterally.  Assessment and plan:  77 years old right-handed African American male,With past medical history of stroke, involving left occipital, cerebellum region, diabetes, atrial fibrillation, hypertension, hyperlipidemia, post stroke seizure, on examination, he has right and left confusion, right neglect, difficulty reading writing, naming, Mini-Mental Status Examination is only 7 out of 30,  1, continue Keppra, 500 mg twice a day, 2, continue Coumadin, 3, medical record from [facility] [place] hospital, 4 return to clinic in 2-3 months.

## 2013-01-13 DIAGNOSIS — R9389 Abnormal findings on diagnostic imaging of other specified body structures: Secondary | ICD-10-CM | POA: Diagnosis not present

## 2013-01-16 NOTE — Addendum Note (Signed)
Addended byHermenia Fiscal on: 01/16/2013 03:32 PM   Modules accepted: Orders

## 2013-01-20 DIAGNOSIS — I10 Essential (primary) hypertension: Secondary | ICD-10-CM | POA: Diagnosis not present

## 2013-01-20 DIAGNOSIS — Z8673 Personal history of transient ischemic attack (TIA), and cerebral infarction without residual deficits: Secondary | ICD-10-CM | POA: Diagnosis not present

## 2013-01-20 DIAGNOSIS — E119 Type 2 diabetes mellitus without complications: Secondary | ICD-10-CM | POA: Diagnosis not present

## 2013-01-20 DIAGNOSIS — E785 Hyperlipidemia, unspecified: Secondary | ICD-10-CM | POA: Diagnosis not present

## 2013-01-20 DIAGNOSIS — Z7901 Long term (current) use of anticoagulants: Secondary | ICD-10-CM | POA: Diagnosis not present

## 2013-01-20 DIAGNOSIS — F329 Major depressive disorder, single episode, unspecified: Secondary | ICD-10-CM | POA: Diagnosis not present

## 2013-01-20 DIAGNOSIS — K5900 Constipation, unspecified: Secondary | ICD-10-CM | POA: Diagnosis not present

## 2013-01-20 DIAGNOSIS — I4891 Unspecified atrial fibrillation: Secondary | ICD-10-CM | POA: Diagnosis not present

## 2013-01-24 ENCOUNTER — Ambulatory Visit (HOSPITAL_COMMUNITY): Payer: Medicare Other | Attending: Cardiology | Admitting: Radiology

## 2013-01-24 DIAGNOSIS — I639 Cerebral infarction, unspecified: Secondary | ICD-10-CM

## 2013-01-24 DIAGNOSIS — R569 Unspecified convulsions: Secondary | ICD-10-CM

## 2013-01-24 DIAGNOSIS — F329 Major depressive disorder, single episode, unspecified: Secondary | ICD-10-CM

## 2013-01-24 DIAGNOSIS — I10 Essential (primary) hypertension: Secondary | ICD-10-CM | POA: Insufficient documentation

## 2013-01-24 DIAGNOSIS — E119 Type 2 diabetes mellitus without complications: Secondary | ICD-10-CM | POA: Diagnosis not present

## 2013-01-24 DIAGNOSIS — E785 Hyperlipidemia, unspecified: Secondary | ICD-10-CM | POA: Insufficient documentation

## 2013-01-24 DIAGNOSIS — Z8673 Personal history of transient ischemic attack (TIA), and cerebral infarction without residual deficits: Secondary | ICD-10-CM | POA: Diagnosis not present

## 2013-01-24 DIAGNOSIS — I4891 Unspecified atrial fibrillation: Secondary | ICD-10-CM | POA: Diagnosis not present

## 2013-01-24 DIAGNOSIS — F32A Depression, unspecified: Secondary | ICD-10-CM

## 2013-01-24 DIAGNOSIS — I6789 Other cerebrovascular disease: Secondary | ICD-10-CM

## 2013-01-24 NOTE — Progress Notes (Signed)
Echocardiogram performed.  

## 2013-01-27 DIAGNOSIS — Z7901 Long term (current) use of anticoagulants: Secondary | ICD-10-CM | POA: Diagnosis not present

## 2013-01-28 DIAGNOSIS — I6340 Cerebral infarction due to embolism of unspecified cerebral artery: Secondary | ICD-10-CM | POA: Diagnosis not present

## 2013-01-28 DIAGNOSIS — H2510 Age-related nuclear cataract, unspecified eye: Secondary | ICD-10-CM | POA: Diagnosis not present

## 2013-01-31 ENCOUNTER — Other Ambulatory Visit: Payer: Self-pay | Admitting: Neurology

## 2013-02-03 DIAGNOSIS — Z111 Encounter for screening for respiratory tuberculosis: Secondary | ICD-10-CM | POA: Diagnosis not present

## 2013-02-20 DIAGNOSIS — E785 Hyperlipidemia, unspecified: Secondary | ICD-10-CM | POA: Diagnosis not present

## 2013-02-20 DIAGNOSIS — Z7901 Long term (current) use of anticoagulants: Secondary | ICD-10-CM | POA: Diagnosis not present

## 2013-02-20 DIAGNOSIS — K5900 Constipation, unspecified: Secondary | ICD-10-CM | POA: Diagnosis not present

## 2013-02-20 DIAGNOSIS — F329 Major depressive disorder, single episode, unspecified: Secondary | ICD-10-CM | POA: Diagnosis not present

## 2013-02-20 DIAGNOSIS — Z23 Encounter for immunization: Secondary | ICD-10-CM | POA: Diagnosis not present

## 2013-02-20 DIAGNOSIS — E119 Type 2 diabetes mellitus without complications: Secondary | ICD-10-CM | POA: Diagnosis not present

## 2013-02-20 DIAGNOSIS — Z8673 Personal history of transient ischemic attack (TIA), and cerebral infarction without residual deficits: Secondary | ICD-10-CM | POA: Diagnosis not present

## 2013-02-20 DIAGNOSIS — I10 Essential (primary) hypertension: Secondary | ICD-10-CM | POA: Diagnosis not present

## 2013-02-20 DIAGNOSIS — I4891 Unspecified atrial fibrillation: Secondary | ICD-10-CM | POA: Diagnosis not present

## 2013-02-24 DIAGNOSIS — Z7901 Long term (current) use of anticoagulants: Secondary | ICD-10-CM | POA: Diagnosis not present

## 2013-03-25 DIAGNOSIS — Z7901 Long term (current) use of anticoagulants: Secondary | ICD-10-CM | POA: Diagnosis not present

## 2013-04-25 DIAGNOSIS — E119 Type 2 diabetes mellitus without complications: Secondary | ICD-10-CM | POA: Diagnosis not present

## 2013-04-25 DIAGNOSIS — Z0000 Encounter for general adult medical examination without abnormal findings: Secondary | ICD-10-CM | POA: Diagnosis not present

## 2013-04-25 DIAGNOSIS — F329 Major depressive disorder, single episode, unspecified: Secondary | ICD-10-CM | POA: Diagnosis not present

## 2013-04-25 DIAGNOSIS — K5900 Constipation, unspecified: Secondary | ICD-10-CM | POA: Diagnosis not present

## 2013-04-25 DIAGNOSIS — Z23 Encounter for immunization: Secondary | ICD-10-CM | POA: Diagnosis not present

## 2013-04-25 DIAGNOSIS — R159 Full incontinence of feces: Secondary | ICD-10-CM | POA: Diagnosis not present

## 2013-04-25 DIAGNOSIS — Z125 Encounter for screening for malignant neoplasm of prostate: Secondary | ICD-10-CM | POA: Diagnosis not present

## 2013-04-25 DIAGNOSIS — I10 Essential (primary) hypertension: Secondary | ICD-10-CM | POA: Diagnosis not present

## 2013-04-25 DIAGNOSIS — I4891 Unspecified atrial fibrillation: Secondary | ICD-10-CM | POA: Diagnosis not present

## 2013-04-25 DIAGNOSIS — E785 Hyperlipidemia, unspecified: Secondary | ICD-10-CM | POA: Diagnosis not present

## 2013-05-08 ENCOUNTER — Ambulatory Visit: Payer: Medicare Other | Attending: Family Medicine | Admitting: Physical Therapy

## 2013-05-08 DIAGNOSIS — R269 Unspecified abnormalities of gait and mobility: Secondary | ICD-10-CM | POA: Diagnosis not present

## 2013-05-08 DIAGNOSIS — IMO0001 Reserved for inherently not codable concepts without codable children: Secondary | ICD-10-CM | POA: Insufficient documentation

## 2013-05-13 ENCOUNTER — Ambulatory Visit: Payer: Medicare Other | Admitting: Physical Therapy

## 2013-05-22 ENCOUNTER — Ambulatory Visit (INDEPENDENT_AMBULATORY_CARE_PROVIDER_SITE_OTHER): Payer: Medicare Other | Admitting: *Deleted

## 2013-05-22 DIAGNOSIS — I4891 Unspecified atrial fibrillation: Secondary | ICD-10-CM

## 2013-05-22 LAB — MDC_IDC_ENUM_SESS_TYPE_INCLINIC
Battery Remaining Longevity: 18 mo
Brady Statistic RV Percent Paced: 42 %
Implantable Pulse Generator Model: 1198
Implantable Pulse Generator Serial Number: 130187
Lead Channel Setting Pacing Pulse Width: 0.5 ms
Lead Channel Setting Sensing Sensitivity: 2.5 mV

## 2013-05-22 NOTE — Progress Notes (Signed)
Device programmed at appropriate safety margins. Histogram distribution appropriate for patient activity level. Device programmed to optimize intrinsic conduction. Estimated longevity 1.5 years. Patient enrolled in remote follow-up/TTM's with [facility]. Plan to follow every 3 months remotely and see annually in office. Patient education completed.

## 2013-05-31 ENCOUNTER — Other Ambulatory Visit: Payer: Self-pay | Admitting: Neurology

## 2013-06-02 ENCOUNTER — Encounter: Payer: Self-pay | Admitting: Internal Medicine

## 2013-06-03 ENCOUNTER — Other Ambulatory Visit: Payer: Self-pay | Admitting: Neurology

## 2013-06-03 DIAGNOSIS — I4891 Unspecified atrial fibrillation: Secondary | ICD-10-CM | POA: Diagnosis not present

## 2013-06-03 DIAGNOSIS — Z7901 Long term (current) use of anticoagulants: Secondary | ICD-10-CM | POA: Diagnosis not present

## 2013-06-10 DIAGNOSIS — Z7901 Long term (current) use of anticoagulants: Secondary | ICD-10-CM | POA: Diagnosis not present

## 2013-06-10 DIAGNOSIS — I4891 Unspecified atrial fibrillation: Secondary | ICD-10-CM | POA: Diagnosis not present

## 2013-07-08 DIAGNOSIS — I4891 Unspecified atrial fibrillation: Secondary | ICD-10-CM | POA: Diagnosis not present

## 2013-07-08 DIAGNOSIS — Z7901 Long term (current) use of anticoagulants: Secondary | ICD-10-CM | POA: Diagnosis not present

## 2013-07-10 ENCOUNTER — Ambulatory Visit (INDEPENDENT_AMBULATORY_CARE_PROVIDER_SITE_OTHER): Payer: Medicare Other | Admitting: Neurology

## 2013-07-10 ENCOUNTER — Encounter: Payer: Self-pay | Admitting: Neurology

## 2013-07-10 ENCOUNTER — Encounter (INDEPENDENT_AMBULATORY_CARE_PROVIDER_SITE_OTHER): Payer: Self-pay

## 2013-07-10 VITALS — BP 137/78 | HR 74 | Ht 68.0 in | Wt 181.0 lb

## 2013-07-10 DIAGNOSIS — I6350 Cerebral infarction due to unspecified occlusion or stenosis of unspecified cerebral artery: Secondary | ICD-10-CM | POA: Diagnosis not present

## 2013-07-10 DIAGNOSIS — R569 Unspecified convulsions: Secondary | ICD-10-CM | POA: Diagnosis not present

## 2013-07-10 DIAGNOSIS — F329 Major depressive disorder, single episode, unspecified: Secondary | ICD-10-CM | POA: Diagnosis not present

## 2013-07-10 DIAGNOSIS — F3289 Other specified depressive episodes: Secondary | ICD-10-CM | POA: Diagnosis not present

## 2013-07-10 DIAGNOSIS — I4891 Unspecified atrial fibrillation: Secondary | ICD-10-CM

## 2013-07-10 DIAGNOSIS — F32A Depression, unspecified: Secondary | ICD-10-CM

## 2013-07-10 DIAGNOSIS — I639 Cerebral infarction, unspecified: Secondary | ICD-10-CM

## 2013-07-10 DIAGNOSIS — I10 Essential (primary) hypertension: Secondary | ICD-10-CM

## 2013-07-10 NOTE — Progress Notes (Signed)
History of present illness [name] is a 78 years old right-handed African American male, accompanied by his sister referred by primary care doctor Jenelle Mages for evaluation of stroke, seizure,  He had a past medical history of diabetes, hypertension, depression, hyperlipidemia, atrial fibrillation, on chronic Coumadin treatment, he is a poor historian, his sister provides the 63 of the history, he barely volunteer for information  He suffered a stroke in March 2014, was treated at [place], per referring notes, stroke involving left occipital, cerebellar region, he now moves down living with his brother, his 2 sisters checks on him every day,   Prior of stroke he lives alone at [place], now he cannot be left alone, diffuse, not following commands, gait difficulty, difficulty seeing his right side, difficulty reading,  He had a seizure following his stroke, taking Keppra 500 twice a day, there was no recurrent seizures since April 2014 he moved to [place]  He graduated from [employer] school, worked at a Designer, fashion/clothing job,  UPDATE Jul 10 2013:  He came in with his sister today, he is living with his brother now, he ambulate without a cane, he can dress and feed himself, no recurrent seizure, he continues to have significant memory loss.  He talked more than previous visit.  Review of Systems  Out of a complete 14 system review, the patient complains of only the following symptoms, and all other reviewed systems are negative.  Loss right vision, eye pain, hearing loss, and runny nose, memory loss, back pain,    PHYSICAL EXAMINATOINS:  Generalized: In no acute distress  Neck: Supple, no carotid bruits   Cardiac: Regular rate rhythm  Pulmonary: Clear to auscultation bilaterally  Musculoskeletal: No deformity  Neurological examination  Mentation: flat expression, slow speech, cooperative, Right hemineglect, He has difficulty with naming, writing, reading,  Mini-Mental Status  Examination is 11 out of 30,  Cranial nerve II-XII: Pupils were equal round reactive to light extraocular movements were full,  Right visual field cut.  facial sensation and strength were normal. hearing was intact to finger rubbing bilaterally. Uvula tongue midline.  head turning and shoulder shrug and were normal and symmetric.Tongue protrusion into cheek strength was normal.  Motor: normal tone, bulk and strength.   Sensory: intact to light touch  Coordination: Normal finger to nose, heel-to-shin bilaterally there was no truncal ataxia  Gait: cautious, moderate stride. Romberg signs: Negative  Deep tendon reflexes: Brachioradialis 2/2, biceps 2/2, triceps 2/2, patellar 2/2, Achilles 2/2, plantar responses were flexor bilaterally.  Assessment and plan:  23 78 years old right-handed [place] American male, with past medical history of stroke, involving left occipital, cerebellum region, diabetes, atrial fibrillation, hypertension, hyperlipidemia, post stroke seizure, on examination, he has right and left confusion, right hemi-neglect, right visual field cut, right neglect, difficulty reading writing, naming, Mini-Mental Status Examination is only 7 out of 30,  1, continue Keppra, 500 mg twice a day, 2, continue Coumadin, 3, he is overall doing very well, continue to followup with his primary care physician, return to clinic as needed

## 2013-07-17 DIAGNOSIS — Z7901 Long term (current) use of anticoagulants: Secondary | ICD-10-CM | POA: Diagnosis not present

## 2013-07-17 DIAGNOSIS — I4891 Unspecified atrial fibrillation: Secondary | ICD-10-CM | POA: Diagnosis not present

## 2013-07-31 DIAGNOSIS — Z7901 Long term (current) use of anticoagulants: Secondary | ICD-10-CM | POA: Diagnosis not present

## 2013-07-31 DIAGNOSIS — I4891 Unspecified atrial fibrillation: Secondary | ICD-10-CM | POA: Diagnosis not present

## 2013-08-26 ENCOUNTER — Encounter: Payer: Self-pay | Admitting: Internal Medicine

## 2013-08-26 ENCOUNTER — Ambulatory Visit (INDEPENDENT_AMBULATORY_CARE_PROVIDER_SITE_OTHER): Payer: Medicare Other | Admitting: Internal Medicine

## 2013-08-26 VITALS — BP 128/86 | HR 58 | Ht 68.0 in | Wt 178.0 lb

## 2013-08-26 DIAGNOSIS — I6350 Cerebral infarction due to unspecified occlusion or stenosis of unspecified cerebral artery: Secondary | ICD-10-CM | POA: Diagnosis not present

## 2013-08-26 DIAGNOSIS — Z950 Presence of cardiac pacemaker: Secondary | ICD-10-CM | POA: Diagnosis not present

## 2013-08-26 DIAGNOSIS — I639 Cerebral infarction, unspecified: Secondary | ICD-10-CM

## 2013-08-26 DIAGNOSIS — I4891 Unspecified atrial fibrillation: Secondary | ICD-10-CM

## 2013-08-26 LAB — MDC_IDC_ENUM_SESS_TYPE_INCLINIC
Implantable Pulse Generator Model: 1198
Lead Channel Impedance Value: 1030 Ohm
Lead Channel Sensing Intrinsic Amplitude: 9.1 mV
Lead Channel Setting Pacing Amplitude: 2.4 V
Lead Channel Setting Pacing Pulse Width: 0.5 ms
Lead Channel Setting Sensing Sensitivity: 2.5 mV
MDC IDC MSMT LEADCHNL RV PACING THRESHOLD AMPLITUDE: 0.7 V
MDC IDC MSMT LEADCHNL RV PACING THRESHOLD PULSEWIDTH: 0.5 ms
MDC IDC PG SERIAL: 130187
MDC IDC STAT BRADY RV PERCENT PACED: 50 %

## 2013-08-26 NOTE — Assessment & Plan Note (Signed)
He will continue chronic anti-coagulation. No bleeding.

## 2013-08-26 NOTE — Patient Instructions (Signed)
Your physician recommends that you schedule a follow-up appointment in: 3 months with the device clinic

## 2013-08-26 NOTE — Assessment & Plan Note (Signed)
His [facility] single chamber ppm is working normally. Will recheck in several. He is not far from [place].

## 2013-08-26 NOTE — Assessment & Plan Note (Signed)
His ventricular rate is on the slow side. Will continue his current meds.

## 2013-08-26 NOTE — Progress Notes (Signed)
HPI Brent Dunn is referred today by Dr. Irish Lack for ongoing evaluation of his PPM. He is a pleasant 78 yo man with a h/o chronic atrial fibrillaion, symptomatic bradycardia and is s/p PPM insertion. He moved from [place] to [place] approximately a year ago to be closer to family. He has a remote stroke and a right HP. He has decreased vision from he stroke. He has chronic dyspnea which is class 2. No syncope or chest pain. No Known Allergies   Current Outpatient Prescriptions  Medication Sig Dispense Refill  . acetaminophen (TYLENOL) 650 MG CR tablet Take 650 mg by mouth every 8 (eight) hours as needed for pain.      Marland Kitchen amLODipine (NORVASC) 2.5 MG tablet Take 2.5 mg by mouth daily.      Marland Kitchen atorvastatin (LIPITOR) 40 MG tablet Take 40 mg by mouth daily.      Marland Kitchen escitalopram (LEXAPRO) 10 MG tablet Take 10 mg by mouth daily.      . folic acid (FOLVITE) 0.5 MG tablet Take 1 mg by mouth daily.      Marland Kitchen levETIRAcetam (KEPPRA) 500 MG tablet TAKE 1 TABLET BY MOUTH EVERY 12 HOURS  60 tablet  1  . lisinopril (PRINIVIL,ZESTRIL) 10 MG tablet Take 10 mg by mouth daily.      . metformin (FORTAMET) 500 MG (OSM) 24 hr tablet Take 500 mg by mouth 2 (two) times daily.      . metoprolol (LOPRESSOR) 50 MG tablet Take 50 mg by mouth 2 (two) times daily.       Marland Kitchen warfarin (COUMADIN) 6 MG tablet Take as directed by the coumadin clinic      . warfarin (COUMADIN) 7.5 MG tablet Take as directed by the coumadin clinic       No current facility-administered medications for this visit.     Past Medical History  Diagnosis Date  . Stroke   . Atrial fibrillation   . High blood pressure   . Depression   . Diabetes mellitus type 2, controlled   . High cholesterol   . History of stroke     ROS:   All systems reviewed and negative except as noted in the HPI.   Past Surgical History  Procedure Laterality Date  . Appendectomy    . Pacemaker insertion       Family History  Problem Relation Age of Onset  .  Stroke Father   . High blood pressure Father   . Stroke Mother   . Diabetes Sister   . Cancer Brother     colon     History   Social History  . Marital Status: Unknown    Spouse Name: N/A    Number of Children: 2  . Years of Education: 12   Occupational History  .      Retired   Social History Main Topics  . Smoking status: Former Smoker    Types: Cigarettes  . Smokeless tobacco: Never Used     Comment: 1980's  . Alcohol Use: No  . Drug Use: No  . Sexual Activity: Not on file   Other Topics Concern  . Not on file   Social History Narrative   Patient  Lives at home with his brother  [name]) Patient is retired. Patient has a 12th grade education. Right handed.   Caffeine - None      Sister [name] - [phone]   [name] Brother- [phone]     BP  128/86  Pulse 58  Ht 5\' 8"  (1.727 m)  Wt 178 lb (80.74 kg)  BMI 27.07 kg/m2  Physical Exam:  Well appearing NAD HEENT: Unremarkable Neck:  No JVD, no thyromegally Back:  No CVA tenderness Lungs:  Clear with no wheezes HEART:  IRegular rate rhythm, no murmurs, no rubs, no clicks Abd:  soft, positive bowel sounds, no organomegally, no rebound, no guarding Ext:  2 plus pulses, no edema, no cyanosis, no clubbing Skin:  No rashes no nodules Neuro:  CN II through XII intact, motor grossly intact  EKG - atrial fib with a controled VR  DEVICE  Normal device function.  See PaceArt for details.   Assess/Plan:

## 2013-08-28 DIAGNOSIS — I4891 Unspecified atrial fibrillation: Secondary | ICD-10-CM | POA: Diagnosis not present

## 2013-08-28 DIAGNOSIS — Z7901 Long term (current) use of anticoagulants: Secondary | ICD-10-CM | POA: Diagnosis not present

## 2013-09-02 ENCOUNTER — Encounter: Payer: Self-pay | Admitting: Internal Medicine

## 2013-09-11 DIAGNOSIS — I4891 Unspecified atrial fibrillation: Secondary | ICD-10-CM | POA: Diagnosis not present

## 2013-09-11 DIAGNOSIS — Z7901 Long term (current) use of anticoagulants: Secondary | ICD-10-CM | POA: Diagnosis not present

## 2013-10-06 ENCOUNTER — Other Ambulatory Visit: Payer: Self-pay | Admitting: Neurology

## 2013-10-07 ENCOUNTER — Other Ambulatory Visit: Payer: Self-pay | Admitting: Neurology

## 2013-10-13 DIAGNOSIS — I4891 Unspecified atrial fibrillation: Secondary | ICD-10-CM | POA: Diagnosis not present

## 2013-10-13 DIAGNOSIS — Z7901 Long term (current) use of anticoagulants: Secondary | ICD-10-CM | POA: Diagnosis not present

## 2013-10-15 ENCOUNTER — Encounter: Payer: Self-pay | Admitting: Interventional Cardiology

## 2013-10-15 ENCOUNTER — Other Ambulatory Visit: Payer: Self-pay | Admitting: Neurology

## 2013-10-16 ENCOUNTER — Other Ambulatory Visit: Payer: Self-pay | Admitting: Neurology

## 2013-10-16 NOTE — Telephone Encounter (Signed)
We have already sent this Rx to the pharmacy 3 times.  I called and spoke with the pharmacist.  She verified they did get the Rx and it is ready for pick up.  She says the patient keeps calling requesting a refill on his old bottle, so it keeps populating refill requests.

## 2013-10-20 DIAGNOSIS — Z7901 Long term (current) use of anticoagulants: Secondary | ICD-10-CM | POA: Diagnosis not present

## 2013-10-20 DIAGNOSIS — I4891 Unspecified atrial fibrillation: Secondary | ICD-10-CM | POA: Diagnosis not present

## 2013-11-03 DIAGNOSIS — I4891 Unspecified atrial fibrillation: Secondary | ICD-10-CM | POA: Diagnosis not present

## 2013-11-03 DIAGNOSIS — Z7901 Long term (current) use of anticoagulants: Secondary | ICD-10-CM | POA: Diagnosis not present

## 2013-11-18 ENCOUNTER — Ambulatory Visit: Payer: Medicare Other | Admitting: Interventional Cardiology

## 2013-11-24 ENCOUNTER — Ambulatory Visit (INDEPENDENT_AMBULATORY_CARE_PROVIDER_SITE_OTHER): Payer: Medicare Other | Admitting: *Deleted

## 2013-11-24 DIAGNOSIS — I4891 Unspecified atrial fibrillation: Secondary | ICD-10-CM | POA: Diagnosis not present

## 2013-11-24 LAB — MDC_IDC_ENUM_SESS_TYPE_INCLINIC
Battery Remaining Longevity: 12 mo
Implantable Pulse Generator Model: 1198
Implantable Pulse Generator Serial Number: 130187
Lead Channel Impedance Value: 1030 Ohm
Lead Channel Pacing Threshold Amplitude: 0.9 V
Lead Channel Pacing Threshold Pulse Width: 0.5 ms
Lead Channel Setting Pacing Amplitude: 2.4 V
Lead Channel Setting Pacing Pulse Width: 0.5 ms
MDC IDC MSMT LEADCHNL RV SENSING INTR AMPL: 9.7 mV
MDC IDC SET LEADCHNL RV SENSING SENSITIVITY: 2.5 mV
MDC IDC STAT BRADY RV PERCENT PACED: 45 %

## 2013-11-24 NOTE — Progress Notes (Signed)
Pacemaker check in clinic. Normal device function. Thresholds, sensing, impedances consistent with previous measurements. Device programmed to maximize longevity. A-fib, + coumadin.  No high ventricular rates noted. Device programmed at appropriate safety margins. Histogram distribution appropriate for patient activity level. Device programmed to optimize intrinsic conduction. Estimated longevity 1 year. Patient education completed.  ROV 3 months with the device clinic.

## 2013-12-01 DIAGNOSIS — I4891 Unspecified atrial fibrillation: Secondary | ICD-10-CM | POA: Diagnosis not present

## 2013-12-01 DIAGNOSIS — Z7901 Long term (current) use of anticoagulants: Secondary | ICD-10-CM | POA: Diagnosis not present

## 2013-12-04 ENCOUNTER — Encounter: Payer: Self-pay | Admitting: Internal Medicine

## 2013-12-25 ENCOUNTER — Encounter: Payer: Self-pay | Admitting: Interventional Cardiology

## 2013-12-25 ENCOUNTER — Ambulatory Visit (INDEPENDENT_AMBULATORY_CARE_PROVIDER_SITE_OTHER): Payer: Medicare Other | Admitting: Interventional Cardiology

## 2013-12-25 VITALS — BP 118/78 | HR 67 | Ht 68.0 in | Wt 173.0 lb

## 2013-12-25 DIAGNOSIS — I10 Essential (primary) hypertension: Secondary | ICD-10-CM

## 2013-12-25 DIAGNOSIS — I6350 Cerebral infarction due to unspecified occlusion or stenosis of unspecified cerebral artery: Secondary | ICD-10-CM | POA: Diagnosis not present

## 2013-12-25 DIAGNOSIS — I4891 Unspecified atrial fibrillation: Secondary | ICD-10-CM | POA: Diagnosis not present

## 2013-12-25 DIAGNOSIS — Z950 Presence of cardiac pacemaker: Secondary | ICD-10-CM

## 2013-12-25 DIAGNOSIS — I482 Chronic atrial fibrillation, unspecified: Secondary | ICD-10-CM

## 2013-12-25 DIAGNOSIS — I639 Cerebral infarction, unspecified: Secondary | ICD-10-CM

## 2013-12-25 NOTE — Patient Instructions (Signed)
Your physician recommends that you continue on your current medications as directed. Please refer to the Current Medication list given to you today.  Your physician wants you to follow-up in: 1 year with Dr. Varanasi. You will receive a reminder letter in the mail two months in advance. If you don't receive a letter, please call our office to schedule the follow-up appointment.  

## 2013-12-25 NOTE — Progress Notes (Signed)
Patient ID: Brent Dunn, male   DOB: Oct 29, 1935, 78 y.o.   MRN: [id number]    [facility] [place], [place]  [address] Phone: [phone] Fax:  [phone]  Date:  12/25/2013   ID:  Brent Dunn, DOB 01-Feb-1936, MRN [id number]  PCP:  Gara Kroner, MD      History of Present Illness: Brent Dunn is a 78 y.o. male who was diagnosed with AFib whie living in [place] several years ago. He does not remember exactly when that was but it was more than 4 years ago. He has been on COumadin since a recent stroke, which affeced his memory. He did not feel palpitations. He was hospitalized. He deos not recall a cardioversion. He deos not recall an echo. He uses a walker, but he does not recall any falling. His brother brought him here to be closer to family.  He had a pacer placed in the past.  He is going to a senior citizens program for exercise, several times a week.  Walking is limited.  He lives with a family member.    Wt Readings from Last 3 Encounters:  12/25/13 173 lb (78.472 kg)  08/26/13 178 lb (80.74 kg)  07/10/13 181 lb (82.101 kg)     Past Medical History  Diagnosis Date  . Stroke   . Atrial fibrillation   . High blood pressure   . Depression   . Diabetes mellitus type 2, controlled   . High cholesterol   . History of stroke     Current Outpatient Prescriptions  Medication Sig Dispense Refill  . acetaminophen (TYLENOL) 650 MG CR tablet Take 650 mg by mouth every 8 (eight) hours as needed for pain.      Marland Kitchen amLODipine (NORVASC) 2.5 MG tablet Take 2.5 mg by mouth daily.      Marland Kitchen atorvastatin (LIPITOR) 40 MG tablet Take 40 mg by mouth daily.      Marland Kitchen escitalopram (LEXAPRO) 10 MG tablet Take 10 mg by mouth daily.      Marland Kitchen levETIRAcetam (KEPPRA) 500 MG tablet TAKE 1 TABLET BY MOUTH EVERY 12 HOURS  180 tablet  3  . metformin (FORTAMET) 500 MG (OSM) 24 hr tablet Take 500 mg by mouth 2 (two) times daily.      . metoprolol (LOPRESSOR) 50 MG tablet Take 50 mg by mouth 2 (two)  times daily.       Marland Kitchen warfarin (COUMADIN) 6 MG tablet Take as directed by the coumadin clinic       No current facility-administered medications for this visit.    Allergies:   No Known Allergies  Social History:  The patient  reports that he has quit smoking. His smoking use included Cigarettes. He smoked 0.00 packs per day. He has never used smokeless tobacco. He reports that he does not drink alcohol or use illicit drugs.   Family History:  The patient's family history includes Cancer in his brother; Diabetes in his sister; High blood pressure in his father; Stroke in his father and mother.   ROS:  Please see the history of present illness.  No nausea, vomiting.  No fevers, chills.  No focal weakness.  No dysuria.    All other systems reviewed and negative.   PHYSICAL EXAM: VS:  BP 118/78  Pulse 67  Ht 5\' 8"  (1.727 m)  Wt 173 lb (78.472 kg)  BMI 26.31 kg/m2 Well nourished, well developed, in no acute distress HEENT: normal Neck: no JVD, no carotid  bruits Cardiac:  normal S1, S2; irregularly irregular Lungs:  clear to auscultation bilaterally, no wheezing, rhonchi or rales Abd: soft, nontender, no hepatomegaly Ext: no edema Skin: warm and dry Neuro:   no focal abnormalities noted, slow gait, flat affect  EKG:    AFib, rate controled  ASSESSMENT AND PLAN:  Atrial fibrillation  Continue Metoprolol Tartrate Tablet, 50 MG, 1 tablet, by mouth, Twice a day IMAGING: EKG    Harward,Amy 11/18/2012 02:07:26 PM > Arshia Rondon,JAY 11/18/2012 02:22:19 PM > AFib, V paced .   Notes: Rate controlled. H/O stroke. Needs anticoagulation for stroke prevention. Managed by Dr. Moreen Fowler.    2. Hypertension  Continue Lisinopril Tablet, 10 MG, 1 tablet, by mouth, Once a day Notes: Controlled.    3. Cardiac pacemaker in situ  Notes: Pacemaker clinic for routine f/u.    Preventive Medicine  Adult topics discussed:  Diet: healthy diet.  Exercise: 5 days a week, at least 30 minutes of aerobic  exercise.      Signed, Mina Marble, MD, [facility] 12/25/2013 1:31 PM

## 2013-12-29 DIAGNOSIS — I4891 Unspecified atrial fibrillation: Secondary | ICD-10-CM | POA: Diagnosis not present

## 2013-12-29 DIAGNOSIS — Z7901 Long term (current) use of anticoagulants: Secondary | ICD-10-CM | POA: Diagnosis not present

## 2014-01-29 DIAGNOSIS — E785 Hyperlipidemia, unspecified: Secondary | ICD-10-CM | POA: Diagnosis not present

## 2014-01-29 DIAGNOSIS — Z23 Encounter for immunization: Secondary | ICD-10-CM | POA: Diagnosis not present

## 2014-01-29 DIAGNOSIS — K5900 Constipation, unspecified: Secondary | ICD-10-CM | POA: Diagnosis not present

## 2014-01-29 DIAGNOSIS — K625 Hemorrhage of anus and rectum: Secondary | ICD-10-CM | POA: Diagnosis not present

## 2014-01-29 DIAGNOSIS — Z7901 Long term (current) use of anticoagulants: Secondary | ICD-10-CM | POA: Diagnosis not present

## 2014-01-29 DIAGNOSIS — I4891 Unspecified atrial fibrillation: Secondary | ICD-10-CM | POA: Diagnosis not present

## 2014-01-29 DIAGNOSIS — I10 Essential (primary) hypertension: Secondary | ICD-10-CM | POA: Diagnosis not present

## 2014-01-29 DIAGNOSIS — E119 Type 2 diabetes mellitus without complications: Secondary | ICD-10-CM | POA: Diagnosis not present

## 2014-01-29 DIAGNOSIS — Z8673 Personal history of transient ischemic attack (TIA), and cerebral infarction without residual deficits: Secondary | ICD-10-CM | POA: Diagnosis not present

## 2014-01-29 DIAGNOSIS — F329 Major depressive disorder, single episode, unspecified: Secondary | ICD-10-CM | POA: Diagnosis not present

## 2014-02-05 DIAGNOSIS — F29 Unspecified psychosis not due to a substance or known physiological condition: Secondary | ICD-10-CM | POA: Diagnosis not present

## 2014-02-05 DIAGNOSIS — N3944 Nocturnal enuresis: Secondary | ICD-10-CM | POA: Diagnosis not present

## 2014-02-05 DIAGNOSIS — R32 Unspecified urinary incontinence: Secondary | ICD-10-CM | POA: Diagnosis not present

## 2014-02-05 DIAGNOSIS — Z8673 Personal history of transient ischemic attack (TIA), and cerebral infarction without residual deficits: Secondary | ICD-10-CM | POA: Diagnosis not present

## 2014-02-06 ENCOUNTER — Other Ambulatory Visit: Payer: Self-pay | Admitting: Family Medicine

## 2014-02-09 ENCOUNTER — Other Ambulatory Visit: Payer: Self-pay | Admitting: Family Medicine

## 2014-02-09 DIAGNOSIS — R410 Disorientation, unspecified: Secondary | ICD-10-CM

## 2014-02-12 ENCOUNTER — Ambulatory Visit
Admission: RE | Admit: 2014-02-12 | Discharge: 2014-02-12 | Disposition: A | Discharge status: Home / Self Care | Payer: Medicare Other | Source: Ambulatory Visit | Attending: Family Medicine | Admitting: Family Medicine

## 2014-02-12 DIAGNOSIS — G9389 Other specified disorders of brain: Secondary | ICD-10-CM | POA: Diagnosis not present

## 2014-02-12 DIAGNOSIS — R410 Disorientation, unspecified: Secondary | ICD-10-CM

## 2014-02-25 ENCOUNTER — Ambulatory Visit (INDEPENDENT_AMBULATORY_CARE_PROVIDER_SITE_OTHER): Payer: Medicare Other | Admitting: *Deleted

## 2014-02-25 DIAGNOSIS — I482 Chronic atrial fibrillation, unspecified: Secondary | ICD-10-CM

## 2014-02-25 DIAGNOSIS — I4891 Unspecified atrial fibrillation: Secondary | ICD-10-CM | POA: Diagnosis not present

## 2014-02-25 LAB — MDC_IDC_ENUM_SESS_TYPE_INCLINIC
Brady Statistic RV Percent Paced: 43 %
Implantable Pulse Generator Model: 1198
Lead Channel Impedance Value: 1030 Ohm
Lead Channel Pacing Threshold Amplitude: 0.9 V
Lead Channel Pacing Threshold Pulse Width: 0.5 ms
Lead Channel Sensing Intrinsic Amplitude: 7.4 mV
Lead Channel Setting Pacing Amplitude: 2.4 V
Lead Channel Setting Pacing Pulse Width: 0.5 ms
MDC IDC MSMT BATTERY REMAINING LONGEVITY: 12 mo
MDC IDC PG SERIAL: 130187
MDC IDC SET LEADCHNL RV SENSING SENSITIVITY: 2.5 mV

## 2014-02-25 NOTE — Progress Notes (Signed)
Pacemaker check in clinic. Normal device function. Thresholds, sensing, impedances consistent with previous measurements. Device programmed to maximize longevity. 2 high ventricular rates noted. Device programmed at appropriate safety margins. Histogram distribution appropriate for patient activity level. Device programmed to optimize intrinsic conduction. Estimated longevity 1 year.  Patient education completed.  ROV 6 months with Dr. Lovena Le.

## 2014-03-05 DIAGNOSIS — Z7901 Long term (current) use of anticoagulants: Secondary | ICD-10-CM | POA: Diagnosis not present

## 2014-03-05 DIAGNOSIS — I4891 Unspecified atrial fibrillation: Secondary | ICD-10-CM | POA: Diagnosis not present

## 2014-03-10 ENCOUNTER — Encounter: Payer: Self-pay | Admitting: Internal Medicine

## 2014-04-06 DIAGNOSIS — Z7901 Long term (current) use of anticoagulants: Secondary | ICD-10-CM | POA: Diagnosis not present

## 2014-04-06 DIAGNOSIS — I4891 Unspecified atrial fibrillation: Secondary | ICD-10-CM | POA: Diagnosis not present

## 2014-04-20 DIAGNOSIS — F329 Major depressive disorder, single episode, unspecified: Secondary | ICD-10-CM | POA: Diagnosis not present

## 2014-04-20 DIAGNOSIS — I10 Essential (primary) hypertension: Secondary | ICD-10-CM | POA: Diagnosis not present

## 2014-04-20 DIAGNOSIS — N3944 Nocturnal enuresis: Secondary | ICD-10-CM | POA: Diagnosis not present

## 2014-04-20 DIAGNOSIS — K5900 Constipation, unspecified: Secondary | ICD-10-CM | POA: Diagnosis not present

## 2014-04-20 DIAGNOSIS — E119 Type 2 diabetes mellitus without complications: Secondary | ICD-10-CM | POA: Diagnosis not present

## 2014-04-20 DIAGNOSIS — Z8673 Personal history of transient ischemic attack (TIA), and cerebral infarction without residual deficits: Secondary | ICD-10-CM | POA: Diagnosis not present

## 2014-04-20 DIAGNOSIS — I4891 Unspecified atrial fibrillation: Secondary | ICD-10-CM | POA: Diagnosis not present

## 2014-04-20 DIAGNOSIS — E785 Hyperlipidemia, unspecified: Secondary | ICD-10-CM | POA: Diagnosis not present

## 2014-04-27 ENCOUNTER — Encounter: Payer: Self-pay | Admitting: Neurology

## 2014-04-27 ENCOUNTER — Ambulatory Visit (INDEPENDENT_AMBULATORY_CARE_PROVIDER_SITE_OTHER): Payer: Medicare Other | Admitting: Neurology

## 2014-04-27 VITALS — BP 121/78 | HR 56 | Ht 68.0 in | Wt 176.0 lb

## 2014-04-27 DIAGNOSIS — F0390 Unspecified dementia without behavioral disturbance: Secondary | ICD-10-CM | POA: Diagnosis not present

## 2014-04-27 DIAGNOSIS — I639 Cerebral infarction, unspecified: Secondary | ICD-10-CM | POA: Diagnosis not present

## 2014-04-27 MED ORDER — DONEPEZIL HCL 10 MG PO TABS: 10.0000 mg | ORAL_TABLET | Freq: Every day | ORAL | Status: DC

## 2014-04-27 NOTE — Progress Notes (Signed)
History of present illness Brent Dunn is a 78 years old right-handed African American male, accompanied by his sister referred by primary care doctor Brent Dunn for evaluation of stroke, seizure,  He had a past medical history of diabetes, hypertension, depression, hyperlipidemia, atrial fibrillation, on chronic Coumadin treatment, also has pacemaker,he is a poor historian, his sister provides the majority of the history, he barely volunteer for information  He suffered a stroke in March 2014, was treated at [facility], per referring notes, stroke involving left occipital, cerebellar region, he now moves down living with his brother, his 2 sisters checks on him every day,   Prior of stroke he lives alone at [place] [facility], now he cannot be left alone, confused, not following commands, gait difficulty, difficulty seeing his right side, difficulty reading,  He had a seizure following his stroke, taking Keppra 500 twice a day, there was no recurrent seizures since April 2014 he moved to [place]  He graduated from [employer] school, worked at a Designer, fashion/clothing job,  UPDATE Jul 10 2013:  He came in with his sister today, he is living with his brother now, he ambulate without a cane, he can dress and feed himself, no recurrent seizure, he continues to have significant memory loss.  He talked more than previous visit.  UPDATE Nov 9th 2015: He is with his brother Brent Dunn today,  He is getting more confused, especially in morning time, sometimes wet himself,  He has a Actuary daily.  He is quiet, no seiuzre, he sits quietly most of time, he can dress, bath feed himself, talk occasionally.  He is eating well, sleeping well.    He lives with his brother Brent Dunn, sometimes visiting with his sister.    Most recent CT head in August 2015, No evidence of acute intracranial abnormality. Encephalomalacic changes related to prior infarcts. Atrophy with small vessel ischemic changes and intracranial atherosclerosis.  Review of  Systems  Out of a complete 14 system review, the patient complains of only the following symptoms, and all other reviewed systems are negative.  As above    PHYSICAL EXAMINATOINS:  Generalized: In no acute distress  Neck: Supple, no carotid bruits   Cardiac: irregular rate rhythm  Pulmonary: Clear to auscultation bilaterally  Musculoskeletal: No deformity  Neurological examination  Mentation: flat expression, slow speech, cooperative, Right hemineglect, He has difficulty with naming, writing, reading,  Mini-Mental Status Examination is 11 out of 30, He is not oriented to his children name, nor to his age.  Cranial nerve II-XII: Pupils were equal round reactive to light extraocular movements were full,  Right visual field cut.  facial sensation and strength were normal. hearing was intact to finger rubbing bilaterally. Uvula tongue midline.  head turning and shoulder shrug and were normal and symmetric.Tongue protrusion into cheek strength was normal.  Motor: normal tone, bulk and strength.   Sensory: intact to light touch  Coordination: Normal finger to nose, heel-to-shin bilaterally there was no truncal ataxia  Gait: cautious, moderate stride, decreased right arm swing Romberg signs: Negative  Deep tendon reflexes: Brachioradialis 2/2, biceps 2/2, triceps 2/2, patellar 2/2, Achilles 2/2, plantar responses were flexor bilaterally.  Assessment and plan:  78  years old right-handed [place] American male, with past medical history of stroke, involving left occipital, cerebellum region, diabetes, atrial fibrillation, hypertension, hyperlipidemia, post stroke seizure, on examination, he has right and left confusion, right hemi-neglect, right visual field cut, right neglect, difficulty reading writing, naming, Mini-Mental Status Examination is 11 out of  30,  1, continue Keppra, 500 mg twice a day, 2, continue Coumadin, 3. His increased confusion could due to nature progression of  his multiple strokes, aging process vs subclinical stroke, EEG. 4. Aricept 10mg  qday 5. RTC in 2 months

## 2014-04-28 ENCOUNTER — Telehealth: Payer: Self-pay | Admitting: Neurology

## 2014-04-28 NOTE — Telephone Encounter (Signed)
Left message for patient regarding scheduling EEG per Dr. Krista Blue.

## 2014-05-06 DIAGNOSIS — Z7901 Long term (current) use of anticoagulants: Secondary | ICD-10-CM | POA: Diagnosis not present

## 2014-05-06 DIAGNOSIS — I4891 Unspecified atrial fibrillation: Secondary | ICD-10-CM | POA: Diagnosis not present

## 2014-06-05 DIAGNOSIS — I10 Essential (primary) hypertension: Secondary | ICD-10-CM | POA: Diagnosis not present

## 2014-06-05 DIAGNOSIS — E119 Type 2 diabetes mellitus without complications: Secondary | ICD-10-CM | POA: Diagnosis not present

## 2014-06-05 DIAGNOSIS — F329 Major depressive disorder, single episode, unspecified: Secondary | ICD-10-CM | POA: Diagnosis not present

## 2014-06-05 DIAGNOSIS — E785 Hyperlipidemia, unspecified: Secondary | ICD-10-CM | POA: Diagnosis not present

## 2014-06-05 DIAGNOSIS — I4891 Unspecified atrial fibrillation: Secondary | ICD-10-CM | POA: Diagnosis not present

## 2014-06-05 DIAGNOSIS — K5900 Constipation, unspecified: Secondary | ICD-10-CM | POA: Diagnosis not present

## 2014-06-05 DIAGNOSIS — Z8673 Personal history of transient ischemic attack (TIA), and cerebral infarction without residual deficits: Secondary | ICD-10-CM | POA: Diagnosis not present

## 2014-06-05 DIAGNOSIS — N3944 Nocturnal enuresis: Secondary | ICD-10-CM | POA: Diagnosis not present

## 2014-06-29 ENCOUNTER — Encounter: Payer: Self-pay | Admitting: Neurology

## 2014-06-29 ENCOUNTER — Ambulatory Visit (INDEPENDENT_AMBULATORY_CARE_PROVIDER_SITE_OTHER): Payer: Medicare Other | Admitting: Neurology

## 2014-06-29 VITALS — BP 122/77 | HR 78 | Ht 68.0 in | Wt 164.0 lb

## 2014-06-29 DIAGNOSIS — R569 Unspecified convulsions: Secondary | ICD-10-CM | POA: Diagnosis not present

## 2014-06-29 DIAGNOSIS — I639 Cerebral infarction, unspecified: Secondary | ICD-10-CM | POA: Diagnosis not present

## 2014-06-29 DIAGNOSIS — F0390 Unspecified dementia without behavioral disturbance: Secondary | ICD-10-CM

## 2014-06-29 MED ORDER — MEMANTINE HCL 10 MG PO TABS: 10.0000 mg | ORAL_TABLET | Freq: Two times a day (BID) | ORAL | Status: DC

## 2014-06-29 NOTE — Progress Notes (Signed)
History of present illness [name] is a 79 years old right-handed African American male, accompanied by his sister, referred by primary care doctor Jenelle Mages for evaluation of stroke, seizure,  He had a past medical history of diabetes, hypertension, depression, hyperlipidemia, atrial fibrillation, on chronic Coumadin treatment, also has pacemaker,he is a poor historian, his sister provides the majority of the history, he barely volunteer for information  He suffered a stroke in March 2014, was treated at [place], per referring notes, stroke involving left occipital, cerebellar region, he now moves down living with his brother, his 2 sisters checks on him every day,   Prior of stroke he lives alone at [place] [facility], but he cannot be left alone, confused, not following commands, gait difficulty, difficulty seeing his right side, difficulty reading,  He had a seizure following his stroke, taking Keppra 500 twice a day, there was no recurrent seizures since April 2014 after he moved to [place]  He graduated from [employer] school, worked at a Designer, fashion/clothing job,  UPDATE Jul 10 2013:  He came in with his sister today, he is living with his brother now, he ambulate without assistance, he can dress and feed himself, no recurrent seizure, he continues to have significant memory loss.  He talked more than previous visit.  UPDATE Nov 9th 2015: He is with his brother Brent Dunn today,  He is getting more confused, especially in morning time, sometimes wet himself,  He has a Actuary daily.  He is quiet, no seiuzre, he sits quietly most of time, he can dress, bath feed himself, talk occasionally.  He is eating well, sleeping well.    He lives with his brother Brent Dunn, sometimes visiting with his sisters.    CT head in August 2015, No evidence of acute intracranial abnormality. Encephalomalacic changes related to prior infarcts. Atrophy with small vessel ischemic changes and intracranial atherosclerosis.  UPDATE Jan  11th 2016: He was started on Aricept since August 2015, he has no seizure, continue have slow progression memory trouble.  Review of Systems  Out of a complete 14 system review, the patient complains of only the following symptoms, and all other reviewed systems are negative.  As above    PHYSICAL EXAMINATOINS:  Generalized: In no acute distress  Neck: Supple, no carotid bruits   Cardiac: irregular rate rhythm  Pulmonary: Clear to auscultation bilaterally  Musculoskeletal: No deformity  Neurological examination  Mentation: flat expression, slow speech, cooperative, Right hemineglect, He has difficulty with naming, writing, reading,  Mini-Mental Status Examination is 12 out of 30, missed 3 out of 3 recall, He is not oriented to his children name, nor to his age.  Cranial nerve II-XII: Pupils were equal round reactive to light extraocular movements were full,  Right visual field cut.  facial sensation and strength were normal. hearing was intact to finger rubbing bilaterally. Uvula tongue midline.  head turning and shoulder shrug and were normal and symmetric.Tongue protrusion into cheek strength was normal.  Motor: normal tone, bulk and strength. Right hemi-neglect   Sensory: intact to light touch  Coordination: Normal finger to nose, heel-to-shin bilaterally there was no truncal ataxia  Gait: cautious, moderate stride, decreased right arm swing Romberg signs: Negative  Deep tendon reflexes: Brachioradialis 2/2, biceps 2/2, triceps 2/2, patellar 2/2, Achilles 2/2, plantar responses were flexor bilaterally.  Assessment and plan:  79  years old right-handed [place] American male, with past medical history of stroke, involving left occipital, cerebellum region, diabetes, atrial fibrillation, hypertension, hyperlipidemia, post  stroke seizure, on examination, he has right and left confusion, right hemi-neglect, right visual field cut, right neglect, difficulty reading writing,  naming, Mini-Mental Status Examination is 11 out of 30,  1, continue Keppra, 500 mg twice a day, 2, continue Coumadin, 3. His increased confusion could due to nature progression of his multiple strokes, aging process  4. Aricept 10mg  qday, add on Namenda 5. RTC in 6 months with Rhae Hammock, M.D. Ph.D.  [facility] [place], [place] [address] Phone: [phone] Fax:      [phone]

## 2014-07-07 DIAGNOSIS — N3944 Nocturnal enuresis: Secondary | ICD-10-CM | POA: Diagnosis not present

## 2014-07-07 DIAGNOSIS — N3941 Urge incontinence: Secondary | ICD-10-CM | POA: Diagnosis not present

## 2014-07-16 DIAGNOSIS — H11153 Pinguecula, bilateral: Secondary | ICD-10-CM | POA: Diagnosis not present

## 2014-07-16 DIAGNOSIS — H18413 Arcus senilis, bilateral: Secondary | ICD-10-CM | POA: Diagnosis not present

## 2014-07-16 DIAGNOSIS — H25013 Cortical age-related cataract, bilateral: Secondary | ICD-10-CM | POA: Diagnosis not present

## 2014-07-16 DIAGNOSIS — H40033 Anatomical narrow angle, bilateral: Secondary | ICD-10-CM | POA: Diagnosis not present

## 2014-07-16 DIAGNOSIS — H2513 Age-related nuclear cataract, bilateral: Secondary | ICD-10-CM | POA: Diagnosis not present

## 2014-07-23 ENCOUNTER — Emergency Department (HOSPITAL_COMMUNITY)
Admission: EM | Admit: 2014-07-23 | Discharge: 2014-07-23 | Disposition: A | Discharge status: Home / Self Care | Payer: Medicare Other | Attending: Emergency Medicine | Admitting: Emergency Medicine

## 2014-07-23 ENCOUNTER — Emergency Department (HOSPITAL_COMMUNITY): Payer: Medicare Other

## 2014-07-23 ENCOUNTER — Encounter (HOSPITAL_COMMUNITY): Payer: Self-pay | Admitting: Emergency Medicine

## 2014-07-23 DIAGNOSIS — E780 Pure hypercholesterolemia: Secondary | ICD-10-CM | POA: Insufficient documentation

## 2014-07-23 DIAGNOSIS — Z79899 Other long term (current) drug therapy: Secondary | ICD-10-CM | POA: Diagnosis not present

## 2014-07-23 DIAGNOSIS — R443 Hallucinations, unspecified: Secondary | ICD-10-CM | POA: Diagnosis not present

## 2014-07-23 DIAGNOSIS — R4182 Altered mental status, unspecified: Secondary | ICD-10-CM

## 2014-07-23 DIAGNOSIS — E119 Type 2 diabetes mellitus without complications: Secondary | ICD-10-CM | POA: Diagnosis not present

## 2014-07-23 DIAGNOSIS — I10 Essential (primary) hypertension: Secondary | ICD-10-CM | POA: Diagnosis not present

## 2014-07-23 DIAGNOSIS — Z8673 Personal history of transient ischemic attack (TIA), and cerebral infarction without residual deficits: Secondary | ICD-10-CM | POA: Insufficient documentation

## 2014-07-23 DIAGNOSIS — Z8659 Personal history of other mental and behavioral disorders: Secondary | ICD-10-CM | POA: Diagnosis not present

## 2014-07-23 DIAGNOSIS — Z87891 Personal history of nicotine dependence: Secondary | ICD-10-CM | POA: Insufficient documentation

## 2014-07-23 DIAGNOSIS — R410 Disorientation, unspecified: Secondary | ICD-10-CM | POA: Diagnosis not present

## 2014-07-23 LAB — COMPREHENSIVE METABOLIC PANEL
ALT: 17 U/L (ref 0–53)
AST: 24 U/L (ref 0–37)
Albumin: 3.1 g/dL — ABNORMAL LOW (ref 3.5–5.2)
Alkaline Phosphatase: 69 U/L (ref 39–117)
Anion gap: 5 (ref 5–15)
BUN: 15 mg/dL (ref 6–23)
CO2: 30 mmol/L (ref 19–32)
Calcium: 9 mg/dL (ref 8.4–10.5)
Chloride: 106 mmol/L (ref 96–112)
Creatinine, Ser: 1.23 mg/dL (ref 0.50–1.35)
GFR calc non Af Amer: 55 mL/min — ABNORMAL LOW (ref >90)
GFR, EST AFRICAN AMERICAN: 64 mL/min — AB (ref >90)
Glucose, Bld: 130 mg/dL — ABNORMAL HIGH (ref 70–99)
Potassium: 4.6 mmol/L (ref 3.5–5.1)
Sodium: 141 mmol/L (ref 135–145)
TOTAL PROTEIN: 6.6 g/dL (ref 6.0–8.3)
Total Bilirubin: 0.5 mg/dL (ref 0.3–1.2)

## 2014-07-23 LAB — URINALYSIS, ROUTINE W REFLEX MICROSCOPIC
Bilirubin Urine: NEGATIVE
GLUCOSE, UA: NEGATIVE mg/dL
Hgb urine dipstick: NEGATIVE
Ketones, ur: NEGATIVE mg/dL
Leukocytes, UA: NEGATIVE
Nitrite: NEGATIVE
PH: 5 (ref 5.0–8.0)
PROTEIN: NEGATIVE mg/dL
Specific Gravity, Urine: 1.02 (ref 1.005–1.030)
Urobilinogen, UA: 1 mg/dL (ref 0.0–1.0)

## 2014-07-23 LAB — CBC WITH DIFFERENTIAL/PLATELET
BASOS ABS: 0 10*3/uL (ref 0.0–0.1)
BASOS PCT: 0 % (ref 0–1)
EOS PCT: 5 % (ref 0–5)
Eosinophils Absolute: 0.3 10*3/uL (ref 0.0–0.7)
HCT: 38.7 % — ABNORMAL LOW (ref 39.0–52.0)
Hemoglobin: 12.5 g/dL — ABNORMAL LOW (ref 13.0–17.0)
LYMPHS ABS: 1 10*3/uL (ref 0.7–4.0)
Lymphocytes Relative: 17 % (ref 12–46)
MCH: 29.8 pg (ref 26.0–34.0)
MCHC: 32.3 g/dL (ref 30.0–36.0)
MCV: 92.1 fL (ref 78.0–100.0)
MONOS PCT: 7 % (ref 3–12)
Monocytes Absolute: 0.4 10*3/uL (ref 0.1–1.0)
Neutro Abs: 4.3 10*3/uL (ref 1.7–7.7)
Neutrophils Relative %: 71 % (ref 43–77)
Platelets: 230 10*3/uL (ref 150–400)
RBC: 4.2 MIL/uL — ABNORMAL LOW (ref 4.22–5.81)
RDW: 13.4 % (ref 11.5–15.5)
WBC: 6 10*3/uL (ref 4.0–10.5)

## 2014-07-23 LAB — CBG MONITORING, ED: GLUCOSE-CAPILLARY: 124 mg/dL — AB (ref 70–99)

## 2014-07-23 NOTE — ED Notes (Signed)
Per family pt with hallucinations and increased lethargy x 2 weeks; pt alert to person and place only at present; pt with hx of dementia; family denies other sx

## 2014-07-23 NOTE — ED Notes (Signed)
TTS machine at bedside. 

## 2014-07-23 NOTE — ED Provider Notes (Signed)
CSN: [id number]     Arrival date & time 07/23/14  1819 History   First MD Initiated Contact with Patient 07/23/14 1959     Chief Complaint  Patient presents with  . Altered Mental Status     (Consider location/radiation/quality/duration/timing/severity/associated sxs/prior Treatment) HPI  79 year old male past mental history of dementia, history of hemorrhagic stroke, A. fib, hypertension, diabetes who presents to ED with his brother who provides the history today for 2 weeks of hallucinations and waxing and waning level of consciousness. Brother reports that patient is otherwise been in his usual state of health. He has had a mild cough during this time but denies having any fever, chills. The patient does not seem to have any urinary symptoms. He has a great appetite. Brother states his mobility is greatly and there are no changes in his gait. They have not noted changes in his speech. They noted that he has been sleeping more than normal during the day and waking up at night. They deny him having any signs of agitation. At baseline patientis name but otherwise is unable to know his location, time.   Past Medical History  Diagnosis Date  . Stroke   . Atrial fibrillation   . High blood pressure   . Depression   . Diabetes mellitus type 2, controlled   . High cholesterol   . History of stroke   . Dementia    Past Surgical History  Procedure Laterality Date  . Appendectomy    . Pacemaker insertion     Family History  Problem Relation Age of Onset  . Stroke Father   . High blood pressure Father   . Stroke Mother   . Diabetes Sister   . Cancer Brother     colon   History  Substance Use Topics  . Smoking status: Former Smoker    Types: Cigarettes  . Smokeless tobacco: Never Used     Comment: 1980's  . Alcohol Use: No    Review of Systems Unable to obtain secondary to patient's dementia   Allergies  Review of patient's allergies indicates no known allergies.  Home  Medications   Prior to Admission medications   Medication Sig Start Date End Date Taking? Authorizing Provider  acetaminophen (TYLENOL) 650 MG CR tablet Take 650 mg by mouth every 8 (eight) hours as needed for pain.    Historical Provider, MD  amLODipine (NORVASC) 2.5 MG tablet Take 2.5 mg by mouth daily. 06/20/13   Historical Provider, MD  atorvastatin (LIPITOR) 40 MG tablet Take 40 mg by mouth daily.    Historical Provider, MD  BAYER CONTOUR TEST test strip  02/04/14   Historical Provider, MD  donepezil (ARICEPT) 10 MG tablet Take 1 tablet (10 mg total) by mouth at bedtime. 04/27/14   Marcial Pacas, MD  escitalopram (LEXAPRO) 10 MG tablet Take 10 mg by mouth daily.    Historical Provider, MD  folic acid (FOLVITE) 1 MG tablet Take 1 mg by mouth daily.    Historical Provider, MD  levETIRAcetam (KEPPRA) 500 MG tablet TAKE 1 TABLET BY MOUTH EVERY 12 HOURS    Marcial Pacas, MD  lisinopril (PRINIVIL,ZESTRIL) 20 MG tablet Take 20 mg by mouth daily.    Historical Provider, MD  memantine (NAMENDA) 10 MG tablet Take 1 tablet (10 mg total) by mouth 2 (two) times daily. 06/29/14   Marcial Pacas, MD  metformin (FORTAMET) 500 MG (OSM) 24 hr tablet Take 500 mg by mouth daily.     Historical  Provider, MD  metFORMIN (GLUCOPHAGE) 500 MG tablet Take 500 mg by mouth daily. 03/28/14   Historical Provider, MD  metoprolol (LOPRESSOR) 50 MG tablet Take 50 mg by mouth 2 (two) times daily.  06/27/13   Historical Provider, MD  warfarin (COUMADIN) 6 MG tablet Take as directed by the coumadin clinic    Historical Provider, MD   BP 126/83 mmHg  Pulse 60  Temp(Src) 97.3 F (36.3 C) (Oral)  Resp 19  SpO2 98% Physical Exam  Constitutional: He appears well-developed and well-nourished. No distress.  HENT:  Head: Normocephalic and atraumatic.  Nose: Nose normal.  Mouth/Throat: Oropharynx is clear and moist. No oropharyngeal exudate.  Eyes: Conjunctivae and EOM are normal.  Neck: Normal range of motion. Neck supple. No JVD present.   Cardiovascular: Normal rate, regular rhythm, normal heart sounds and intact distal pulses.   Pulmonary/Chest: Effort normal and breath sounds normal. No respiratory distress.  Abdominal: Soft. He exhibits no distension. There is no tenderness. There is no rebound and no guarding.  Musculoskeletal: Normal range of motion.  Neurological: He is alert. No cranial nerve deficit. GCS eye subscore is 4. GCS verbal subscore is 4. GCS motor subscore is 6.  HDS, AAOx person and place. PERRL, EOMI, TML, face sym. CN 2-12 grossly intact. 5/5 sym, no drift, SILT, normal gait and coordination.    Skin: Skin is warm and dry. No rash noted.  Psychiatric: He has a normal mood and affect.  Nursing note and vitals reviewed.   ED Course  Procedures (including critical care time) Labs Review Labs Reviewed  COMPREHENSIVE METABOLIC PANEL - Abnormal; Notable for the following:    Glucose, Bld 130 (*)    Albumin 3.1 (*)    GFR calc non Af Amer 55 (*)    GFR calc Af Amer 64 (*)    All other components within normal limits  CBC WITH DIFFERENTIAL/PLATELET - Abnormal; Notable for the following:    RBC 4.20 (*)    Hemoglobin 12.5 (*)    HCT 38.7 (*)    All other components within normal limits  CBG MONITORING, ED - Abnormal; Notable for the following:    Glucose-Capillary 124 (*)    All other components within normal limits  URINE CULTURE  URINALYSIS, ROUTINE W REFLEX MICROSCOPIC    Imaging Review No results found.   EKG Interpretation None      MDM   Final diagnoses:  Altered mental status   Brent Dunn is a 79 y.o. male with H&P as above. Patient is hemodynamically stable and in no apparent distress on arrival. Neuro exam is benign. Clinical impression of hallucinations raises concern for either medication induced or psychogenic. We'll screen for infectious cause as well as consult TTS. Screening labs are unremarkable at this time. Chest x-ray is within normal limits. TTS does not find any  mental health disturbance to explain patient's symptoms. I have advised patient's family to have him follow closely with his PCP, neurologist, and have also provided them behavioral health resources. Strict return precautions were discussed and they are ready to go.  Clinical Impression: 1. Hallucinations   2. Altered mental status     Disposition: Discharge  Condition: Good  I have discussed the results, Dx and Tx plan with the pt(& family if present). He/she/they expressed understanding and agree(s) with the plan. Discharge instructions discussed at great length. Strict return precautions discussed and pt &/or family have verbalized understanding of the instructions. No further questions at time of discharge.  New Prescriptions   No medications on file    Follow Up: Gara Kroner, MD [address] [place] [address] [phone]  Schedule an appointment as soon as possible for a visit in 1 week   Marcial Pacas, MD [place] [address] [phone]  Schedule an appointment as soon as possible for a visit in 1 week   [place] [address] [id number] [place] [phone]  If symptoms worsen  [place] [phone] Schedule an appointment as soon as possible for a visit in 1 week    Pt seen in conjunction with Dr. Delora Fuel, MD  Kirstie Peri, [place] [facility] Resident - PGY-2      Kirstie Peri, MD 87/56/43 [id number]  Delora Fuel, MD 18/84/16 [id number]

## 2014-07-23 NOTE — BH Assessment (Addendum)
Tele Assessment Note   Brent Dunn is an 79 y.o. male brought to the [facility] accompanied by brother and sister.  Pt's family reported that pt had been having hallucinations in the last 2 weeks and had been sleeping much more than usual (20 of 24 hours).  Family reported a recent medication change that coinside with the hallucinations.  Pt was a poor historian and brother and sister were able to give information for the assessment with pt's permission.  Pt could not give his birthdate, the day of the week or year but could give his name and the name of the current [place] President.  Per family, pt has a hx of dementia, stroke, diabetes and hypertension.  Pt reported to his brother that he sees hallucinations including helicopters, workers in the streets and sometimes, something coming after him. Per family, pt does not hear any auditory hallucinations.  Per his family, these hallucinations do not upset him. Family reports that pt sleeps 20 of 24 hours a day and eats either 2 or 3 meals a day. Pt's brother stated that pt seems sad but, pt does not have any symptoms of depression except those few that can be explained by other causes. (Tearfulness- his brother says that pt is due for cataract surgery and has increased tearing due to this, for example). Pt denies HI or SH impulses.  Pt and family deny any substance use. Per family, there is no prior history of mental health treatment and no hx of abuse.  Pt was awake, alert and pleasant.  Pt was dressed in scrubs and lying in his hospital bed during the assessment. Pt made fair eye contact and was unremarkable in his movement. Pt's speech was slow, low volume and seemed slurred a bit.  Pt's mood was ambivalent and his flat affect was congruent. Pt's thought processes were clearly impaired as he was unable to answer a number of simple questions.  Pt was not entirely oriented although he did know his name and knew his brother and sister.  Both judgement and insight  were impaired.  Axis I: 311 Unspecified Depressive Disorder Axis II: Deferred Axis III:  Past Medical History  Diagnosis Date  . Stroke   . Atrial fibrillation   . High blood pressure   . Depression   . Diabetes mellitus type 2, controlled   . High cholesterol   . History of stroke   . Dementia    Axis IV: problems related to social environment and problems with primary support group Axis V: 31-40 impairment in reality testing  Past Medical History:  Past Medical History  Diagnosis Date  . Stroke   . Atrial fibrillation   . High blood pressure   . Depression   . Diabetes mellitus type 2, controlled   . High cholesterol   . History of stroke   . Dementia     Past Surgical History  Procedure Laterality Date  . Appendectomy    . Pacemaker insertion      Family History:  Family History  Problem Relation Age of Onset  . Stroke Father   . High blood pressure Father   . Stroke Mother   . Diabetes Sister   . Cancer Brother     colon    Social History:  reports that he has quit smoking. His smoking use included Cigarettes. He has never used smokeless tobacco. He reports that he does not drink alcohol or use illicit drugs.  Additional Social History:  Alcohol /  Drug Use Prescriptions: See PTA list History of alcohol / drug use?: No history of alcohol / drug abuse (per pt, brother and sister)  CIWA: CIWA-Ar BP: 118/87 mmHg Pulse Rate: (!) 53 COWS:    PATIENT STRENGTHS: (choose at least two) Average or above average intelligence Supportive family/friends  Allergies: No Known Allergies  Home Medications:  (Not in a hospital admission)  OB/GYN Status:  No LMP for male patient.  General Assessment Data Location of Assessment: [facility] ED ACT Assessment:  (na) Is this a Tele or Face-to-Face Assessment?: Tele Assessment Is this an Initial Assessment or a Re-assessment for this encounter?: Initial Assessment Living Arrangements: Other relatives (lives with  brother) Can pt return to current living arrangement?: Yes Admission Status: Voluntary Is patient capable of signing voluntary admission?: Yes Transfer from: Home Referral Source: Self/Family/Friend  Medical Screening Exam [facility] Walk-in ONLY) Medical Exam completed: Yes  [place] Living Arrangements: Other relatives (lives with brother) Name of Psychiatrist: none Name of Therapist: none  Education Status Is patient currently in school?: No Current Grade: na Highest grade of school patient has completed: na Name of school: na Contact person: na  Risk to self with the past 6 months Suicidal Ideation: No (denies) Suicidal Intent: No Is patient at risk for suicide?: No Suicidal Plan?: No Access to Means: No What has been your use of drugs/alcohol within the last 12 months?: no use Previous Attempts/Gestures: No How many times?: 0 Other Self Harm Risks: 0 Triggers for Past Attempts:  (none) Intentional Self Injurious Behavior: None Family Suicide History: Unknown Recent stressful life event(s):  (none) Persecutory voices/beliefs?:  (none noted) Depression: Yes Depression Symptoms: Tearfulness, Fatigue, Loss of interest in usual pleasures Substance abuse history and/or treatment for substance abuse?: No Suicide prevention information given to non-admitted patients: Not applicable  Risk to Others within the past 6 months Homicidal Ideation: No Thoughts of Harm to Others: No Current Homicidal Intent: No Current Homicidal Plan: No Access to Homicidal Means: No Identified Victim: na History of harm to others?: No Assessment of Violence: None Noted Violent Behavior Description: na Does patient have access to weapons?: No Criminal Charges Pending?: No Does patient have a court date: No  Psychosis Hallucinations: Visual (Per brother and sister, visual only; sees hellicopters and p) Delusions: None noted  Mental Status Report Appear/Hygiene: In scrubs,  Unremarkable Eye Contact: Fair Motor Activity: Unremarkable Speech: Slow, Soft, Slurred (difficult to understand) Level of Consciousness: Quiet/awake Mood: Ambivalent, Pleasant Affect: Flat Anxiety Level: Minimal Thought Processes:  (could not answer most of assessment questions) Judgement: Impaired Orientation: Person (could not answer day of week, year or DOB) Obsessive Compulsive Thoughts/Behaviors: Unable to Assess  Cognitive Functioning Concentration: Decreased Memory: Unable to Assess IQ: Average Insight: Unable to Assess Impulse Control: Unable to Assess Appetite: Good (per brother) Weight Loss: 0 Weight Gain: 0 Sleep: Increased Total Hours of Sleep: 20 (brother says sleeps 20 of 24 hrs) Vegetative Symptoms: Unable to Assess  ADLScreening [facility]) Patient's cognitive ability adequate to safely complete daily activities?: No Patient able to express need for assistance with ADLs?: No Independently performs ADLs?: No  Prior Inpatient Therapy Prior Inpatient Therapy:  (not for MH issues per family) Prior Therapy Dates: na Prior Therapy Facilty/Provider(s): na Reason for Treatment: na  Prior Outpatient Therapy Prior Outpatient Therapy: No Prior Therapy Dates: na Prior Therapy Facilty/Provider(s): na Reason for Treatment: na  ADL Screening (condition at time of admission) Patient's cognitive ability adequate to safely complete daily activities?:  No Patient able to express need for assistance with ADLs?: No Independently performs ADLs?: No       Abuse/Neglect Assessment (Assessment to be complete while patient is alone) Physical Abuse: Denies Verbal Abuse: Denies Sexual Abuse: Denies Exploitation of patient/patient's resources: Denies Self-Neglect: Denies     Regulatory affairs officer (For Healthcare) Does patient have an advance directive?: No Would patient like information on creating an advanced directive?: No - patient declined information     Additional Information 1:1 In Past 12 Months?: No CIRT Risk: No Elopement Risk: No Does patient have medical clearance?: No    Disposition Initial Assessment Completed for this Encounter: Yes Disposition of Patient: Other dispositions (pending review with [facility]) Other disposition(s): Other (Comment)  Spoke with Glenda Chroman, NP for [facility]: No indication of mental health causation or involvement in symptoms.  No disposition for Behavioral Health.   Spoke with Kirstie Peri, MD/[facility]: [facility] Fortes that assessment did not uncover any mental health issues.  Faylene Kurtz, MS, [facility], [facility] Triage Specialist [facility]  07/23/2014 10:59 PM

## 2014-07-23 NOTE — ED Notes (Signed)
TTS in process 

## 2014-07-24 DIAGNOSIS — H2513 Age-related nuclear cataract, bilateral: Secondary | ICD-10-CM | POA: Diagnosis not present

## 2014-07-24 DIAGNOSIS — I639 Cerebral infarction, unspecified: Secondary | ICD-10-CM | POA: Diagnosis not present

## 2014-07-25 LAB — URINE CULTURE
COLONY COUNT: NO GROWTH
Culture: NO GROWTH

## 2014-08-03 DIAGNOSIS — D649 Anemia, unspecified: Secondary | ICD-10-CM | POA: Diagnosis not present

## 2014-08-03 DIAGNOSIS — F329 Major depressive disorder, single episode, unspecified: Secondary | ICD-10-CM | POA: Diagnosis not present

## 2014-08-03 DIAGNOSIS — R159 Full incontinence of feces: Secondary | ICD-10-CM | POA: Diagnosis not present

## 2014-08-03 DIAGNOSIS — E119 Type 2 diabetes mellitus without complications: Secondary | ICD-10-CM | POA: Diagnosis not present

## 2014-08-03 DIAGNOSIS — Z8673 Personal history of transient ischemic attack (TIA), and cerebral infarction without residual deficits: Secondary | ICD-10-CM | POA: Diagnosis not present

## 2014-08-03 DIAGNOSIS — K5900 Constipation, unspecified: Secondary | ICD-10-CM | POA: Diagnosis not present

## 2014-08-03 DIAGNOSIS — I4891 Unspecified atrial fibrillation: Secondary | ICD-10-CM | POA: Diagnosis not present

## 2014-08-03 DIAGNOSIS — E785 Hyperlipidemia, unspecified: Secondary | ICD-10-CM | POA: Diagnosis not present

## 2014-08-03 DIAGNOSIS — I10 Essential (primary) hypertension: Secondary | ICD-10-CM | POA: Diagnosis not present

## 2014-08-03 DIAGNOSIS — Z7901 Long term (current) use of anticoagulants: Secondary | ICD-10-CM | POA: Diagnosis not present

## 2014-08-03 DIAGNOSIS — N3281 Overactive bladder: Secondary | ICD-10-CM | POA: Diagnosis not present

## 2014-08-06 DIAGNOSIS — H25812 Combined forms of age-related cataract, left eye: Secondary | ICD-10-CM | POA: Diagnosis not present

## 2014-08-06 DIAGNOSIS — H2512 Age-related nuclear cataract, left eye: Secondary | ICD-10-CM | POA: Diagnosis not present

## 2014-08-07 DIAGNOSIS — H2512 Age-related nuclear cataract, left eye: Secondary | ICD-10-CM | POA: Diagnosis not present

## 2014-08-17 DIAGNOSIS — N3944 Nocturnal enuresis: Secondary | ICD-10-CM | POA: Diagnosis not present

## 2014-08-17 DIAGNOSIS — N3941 Urge incontinence: Secondary | ICD-10-CM | POA: Diagnosis not present

## 2014-09-17 ENCOUNTER — Encounter: Payer: Self-pay | Admitting: *Deleted

## 2014-10-14 ENCOUNTER — Encounter: Payer: Self-pay | Admitting: *Deleted

## 2014-10-30 ENCOUNTER — Other Ambulatory Visit: Payer: Self-pay | Admitting: Neurology

## 2014-11-10 DIAGNOSIS — I4891 Unspecified atrial fibrillation: Secondary | ICD-10-CM | POA: Diagnosis not present

## 2014-11-10 DIAGNOSIS — Z7901 Long term (current) use of anticoagulants: Secondary | ICD-10-CM | POA: Diagnosis not present

## 2014-11-10 DIAGNOSIS — N3944 Nocturnal enuresis: Secondary | ICD-10-CM | POA: Diagnosis not present

## 2014-11-20 ENCOUNTER — Encounter: Payer: Self-pay | Admitting: *Deleted

## 2014-12-02 DIAGNOSIS — I4891 Unspecified atrial fibrillation: Secondary | ICD-10-CM | POA: Diagnosis not present

## 2014-12-02 DIAGNOSIS — I4510 Unspecified right bundle-branch block: Secondary | ICD-10-CM | POA: Diagnosis not present

## 2014-12-02 DIAGNOSIS — I2510 Atherosclerotic heart disease of native coronary artery without angina pectoris: Secondary | ICD-10-CM | POA: Diagnosis not present

## 2014-12-02 DIAGNOSIS — R531 Weakness: Secondary | ICD-10-CM | POA: Diagnosis not present

## 2014-12-02 DIAGNOSIS — E860 Dehydration: Secondary | ICD-10-CM | POA: Diagnosis not present

## 2014-12-02 DIAGNOSIS — I639 Cerebral infarction, unspecified: Secondary | ICD-10-CM | POA: Diagnosis not present

## 2014-12-02 DIAGNOSIS — F0390 Unspecified dementia without behavioral disturbance: Secondary | ICD-10-CM | POA: Diagnosis not present

## 2014-12-03 DIAGNOSIS — E860 Dehydration: Secondary | ICD-10-CM | POA: Diagnosis present

## 2014-12-03 DIAGNOSIS — E119 Type 2 diabetes mellitus without complications: Secondary | ICD-10-CM | POA: Diagnosis present

## 2014-12-03 DIAGNOSIS — F0390 Unspecified dementia without behavioral disturbance: Secondary | ICD-10-CM | POA: Diagnosis not present

## 2014-12-03 DIAGNOSIS — I4510 Unspecified right bundle-branch block: Secondary | ICD-10-CM | POA: Diagnosis not present

## 2014-12-03 DIAGNOSIS — K649 Unspecified hemorrhoids: Secondary | ICD-10-CM | POA: Diagnosis present

## 2014-12-03 DIAGNOSIS — R531 Weakness: Secondary | ICD-10-CM | POA: Diagnosis not present

## 2014-12-03 DIAGNOSIS — Z950 Presence of cardiac pacemaker: Secondary | ICD-10-CM | POA: Diagnosis not present

## 2014-12-03 DIAGNOSIS — I4891 Unspecified atrial fibrillation: Secondary | ICD-10-CM | POA: Diagnosis present

## 2014-12-03 DIAGNOSIS — E785 Hyperlipidemia, unspecified: Secondary | ICD-10-CM | POA: Diagnosis present

## 2014-12-03 DIAGNOSIS — Z87891 Personal history of nicotine dependence: Secondary | ICD-10-CM | POA: Diagnosis not present

## 2014-12-03 DIAGNOSIS — I69351 Hemiplegia and hemiparesis following cerebral infarction affecting right dominant side: Secondary | ICD-10-CM | POA: Diagnosis not present

## 2014-12-03 DIAGNOSIS — I10 Essential (primary) hypertension: Secondary | ICD-10-CM | POA: Diagnosis present

## 2014-12-03 DIAGNOSIS — Z7901 Long term (current) use of anticoagulants: Secondary | ICD-10-CM | POA: Diagnosis not present

## 2014-12-10 ENCOUNTER — Ambulatory Visit: Payer: Medicare Other | Attending: Family Medicine | Admitting: Physical Therapy

## 2014-12-28 ENCOUNTER — Ambulatory Visit: Payer: Medicare Other | Admitting: Interventional Cardiology

## 2014-12-30 ENCOUNTER — Ambulatory Visit: Payer: Medicare Other | Admitting: Nurse Practitioner

## 2014-12-30 ENCOUNTER — Telehealth: Payer: Self-pay | Admitting: *Deleted

## 2014-12-30 NOTE — Telephone Encounter (Signed)
LMVM for pt / family member to call back to reschedule appt.  (I relayed missed appt but he did not miss appt had LM to cancel and would reschedule).

## 2015-01-07 ENCOUNTER — Encounter: Payer: Self-pay | Admitting: Nurse Practitioner

## 2015-01-07 ENCOUNTER — Ambulatory Visit (INDEPENDENT_AMBULATORY_CARE_PROVIDER_SITE_OTHER): Payer: Medicare Other | Admitting: Nurse Practitioner

## 2015-01-07 VITALS — BP 126/87 | HR 83 | Temp 97.7°F | Wt 165.0 lb

## 2015-01-07 DIAGNOSIS — F0390 Unspecified dementia without behavioral disturbance: Secondary | ICD-10-CM | POA: Diagnosis not present

## 2015-01-07 DIAGNOSIS — I639 Cerebral infarction, unspecified: Secondary | ICD-10-CM

## 2015-01-07 DIAGNOSIS — R569 Unspecified convulsions: Secondary | ICD-10-CM | POA: Diagnosis not present

## 2015-01-07 MED ORDER — LEVETIRACETAM 500 MG PO TABS: 500.0000 mg | ORAL_TABLET | Freq: Two times a day (BID) | ORAL | Status: DC

## 2015-01-07 NOTE — Patient Instructions (Signed)
Continue Keppra at current dose Continue Namenda at current dose Continue Aricept at current dose Follow-up in 6 months

## 2015-01-07 NOTE — Progress Notes (Signed)
[facility]  PATIENT: Brent Dunn DOB: [birth year]   REASON FOR VISIT: Follow-up for memory disorder HISTORY FROM: Patient and brother  HISTORY OF PRESENT ILLNESS: [name] is a 79 years old right-handed African American male, accompanied by his sister, referred by primary care doctor Jenelle Mages for evaluation of stroke, seizure, He had a past medical history of diabetes, hypertension, depression, hyperlipidemia, atrial fibrillation, on chronic Coumadin treatment, also has pacemaker,he is a poor historian, his sister provides the majority of the history, he barely volunteer for information He suffered a stroke in March 2014, was treated at [place], per referring notes, stroke involving left occipital, cerebellar region, he now moves down living with his brother, his 2 sisters checks on him every day,  Prior of stroke he lives alone at [place], but he cannot be left alone, confused, not following commands, gait difficulty, difficulty seeing his right side, difficulty reading, He had a seizure following his stroke, taking Keppra 500 twice a day, there was no recurrent seizures since April 2014 after he moved to [place] He graduated from [employer] school, worked at a Designer, fashion/clothing job,  UPDATE Jul 10 2013: He came in with his sister today, he is living with his brother now, he ambulate without assistance, he can dress and feed himself, no recurrent seizure, he continues to have significant memory loss. He talked more than previous visit.  UPDATE Nov 9th 2015: He is with his brother Brent Dunn today, He is getting more confused, especially in morning time, sometimes wet himself, He has a Actuary daily. He is quiet, no seiuzre, he sits quietly most of time, he can dress, bath feed himself, talk occasionally. He is eating well, sleeping well.  He lives with his brother Brent Dunn, sometimes visiting with his sisters.  CT head in August 2015, No evidence of acute intracranial  abnormality. Encephalomalacic changes related to prior infarcts. Atrophy with small vessel ischemic changes and intracranial atherosclerosis. UPDATE Jan 11th 2016:He was started on Aricept since August 2015, he has no seizure, continue have slow progression memory trouble. Update 01/07/2015 [name], 79 year old male returns for follow-up with his brother. He has history of memory loss which has been progressive he also has a history of stroke involving the left occipital cerebellum region and poststroke seizures. He is currently on Keppra without any seizure activity since last seen. Memory is stable according to the brother he is on Coumadin for secondary stroke prevention and Aricept and Namenda for his memory loss without side effects. He returns for follow-up  REVIEW OF SYSTEMS: Full 14 system review of systems performed and notable only for those listed, all others are neg:  Constitutional: neg  Cardiovascular: neg Ear/Nose/Throat: neg  Skin: neg Eyes: neg Respiratory: neg Gastroitestinal: neg  Hematology/Lymphatic: neg  Endocrine: neg Musculoskeletal:neg Allergy/Immunology: neg Neurological: neg Psychiatric: neg Sleep : neg   ALLERGIES: No Known Allergies  HOME MEDICATIONS: Outpatient Prescriptions Prior to Visit  Medication Sig Dispense Refill  . acetaminophen (TYLENOL) 650 MG CR tablet Take 650 mg by mouth every 8 (eight) hours as needed for pain.    Marland Kitchen amLODipine (NORVASC) 2.5 MG tablet Take 2.5 mg by mouth daily.    Marland Kitchen atorvastatin (LIPITOR) 40 MG tablet Take 40 mg by mouth daily.    Marland Kitchen BAYER CONTOUR TEST test strip   1  . donepezil (ARICEPT) 10 MG tablet Take 1 tablet (10 mg total) by mouth at bedtime. 30 tablet 11  . escitalopram (LEXAPRO) 10 MG tablet Take 10 mg  by mouth daily.    . folic acid (FOLVITE) 1 MG tablet Take 1 mg by mouth daily.    Marland Kitchen levETIRAcetam (KEPPRA) 500 MG tablet TAKE 1 TABLET BY MOUTH EVERY 12 HOURS 180 tablet 0  . lisinopril (PRINIVIL,ZESTRIL) 20 MG  tablet Take 20 mg by mouth daily.    . memantine (NAMENDA) 10 MG tablet Take 1 tablet (10 mg total) by mouth 2 (two) times daily. 60 tablet 11  . metFORMIN (GLUCOPHAGE) 500 MG tablet Take 500 mg by mouth daily.  5  . metoprolol (LOPRESSOR) 50 MG tablet Take 50 mg by mouth 2 (two) times daily.     Marland Kitchen warfarin (COUMADIN) 6 MG tablet Take as directed by the coumadin clinic    . metformin (FORTAMET) 500 MG (OSM) 24 hr tablet Take 500 mg by mouth daily.      No facility-administered medications prior to visit.    PAST MEDICAL HISTORY: Past Medical History  Diagnosis Date  . Stroke   . Atrial fibrillation   . High blood pressure   . Depression   . Diabetes mellitus type 2, controlled   . High cholesterol   . History of stroke   . Dementia     PAST SURGICAL HISTORY: Past Surgical History  Procedure Laterality Date  . Appendectomy    . Pacemaker insertion      FAMILY HISTORY: Family History  Problem Relation Age of Onset  . Stroke Father   . High blood pressure Father   . Stroke Mother   . Diabetes Sister   . Cancer Brother     colon    SOCIAL HISTORY: History   Social History  . Marital Status: Single    Spouse Name: N/A  . Number of Children: 2  . Years of Education: 12   Occupational History  .      Retired   Social History Main Topics  . Smoking status: Former Smoker    Types: Cigarettes  . Smokeless tobacco: Never Used     Comment: 1980's  . Alcohol Use: No  . Drug Use: No  . Sexual Activity: Not on file   Other Topics Concern  . Not on file   Social History Narrative   Patient  Lives at home with his brother  [name]) Patient is retired. Patient has a 12th grade education. Right handed.   Caffeine - None      Sister Brent Dunn - [phone]   [name] Brother- [phone]     PHYSICAL EXAM  Filed Vitals:   01/07/15 1404  BP: 126/87  Pulse: 83  Temp: 97.7 F (36.5 C)  TempSrc: Oral  Weight: 165 lb (74.844 kg)   Body mass index is 25.09  kg/(m^2). Generalized: In no acute distress Neck: Supple, no carotid bruits  Cardiac: irregular rate rhythm Musculoskeletal: No deformity  Neurological examination Mentation: flat expression, slow speech, cooperative, Right hemineglect, He has difficulty with naming, writing, reading, Mini-Mental Status Examination is 9/30, missed 3 out of 3 recall,  Cranial nerve II-XII: Pupils were equal round reactive to light extraocular movements were full, Right visual field cut. facial sensation and strength were normal. hearing was intact to finger rubbing bilaterally. Uvula tongue midline. head turning and shoulder shrug and were normal and symmetric.Tongue protrusion into cheek strength was normal. Motor: normal tone, bulk and strength. Right hemi-neglect  Sensory: intact to light touch Coordination: Normal finger to nose, heel-to-shin bilaterally there was no truncal ataxia Gait: cautious, moderate stride, decreased right arm swing  Romberg signs: Negative Deep tendon reflexes: Brachioradialis 2/2, biceps 2/2, triceps 2/2, patellar 2/2, Achilles 2/2, plantar responses were flexor bilaterally.  DIAGNOSTIC DATA (LABS, IMAGING, TESTING) - I reviewed patient records, labs, notes, testing and imaging myself where available.  Lab Results  Component Value Date   WBC 6.0 07/23/2014   HGB 12.5* 07/23/2014   HCT 38.7* 07/23/2014   MCV 92.1 07/23/2014   PLT 230 07/23/2014      Component Value Date/Time   NA 141 07/23/2014 1856   K 4.6 07/23/2014 1856   CL 106 07/23/2014 1856   CO2 30 07/23/2014 1856   GLUCOSE 130* 07/23/2014 1856   BUN 15 07/23/2014 1856   CREATININE 1.23 07/23/2014 1856   CALCIUM 9.0 07/23/2014 1856   PROT 6.6 07/23/2014 1856   ALBUMIN 3.1* 07/23/2014 1856   AST 24 07/23/2014 1856   ALT 17 07/23/2014 1856   ALKPHOS 69 07/23/2014 1856   BILITOT 0.5 07/23/2014 1856   GFRNONAA 28* 07/23/2014 1856   GFRAA 44* 07/23/2014 1856    ASSESSMENT AND PLAN  79 y.o. year old  male  has a past medical history of Stroke; Atrial fibrillation; High blood pressure; Depression; Diabetes mellitus type 2, controlled; High cholesterol; History of stroke; and Dementia. here to follow-up.The patient is a current patient of Dr. Krista Blue who is out of the office today . This note is sent to the work in doctor.     Continue Keppra at current dose will refill Continue Namenda at current dose Continue Aricept at current dose Continue Coumadin for secondary stroke prevention Follow-up in 6 months Dennie Bible, [facility], [facility], APRN  [facility] [address] [place], [place] [address] [phone]

## 2015-01-07 NOTE — Progress Notes (Signed)
I agree with the assessment and plan as directed by NP .The patient is known to me .   Deion Swift, MD  

## 2015-01-11 DIAGNOSIS — N3941 Urge incontinence: Secondary | ICD-10-CM | POA: Diagnosis not present

## 2015-01-11 DIAGNOSIS — N3944 Nocturnal enuresis: Secondary | ICD-10-CM | POA: Diagnosis not present

## 2015-01-12 ENCOUNTER — Encounter: Payer: Self-pay | Admitting: *Deleted

## 2015-01-12 DIAGNOSIS — Z7901 Long term (current) use of anticoagulants: Secondary | ICD-10-CM | POA: Diagnosis not present

## 2015-01-12 DIAGNOSIS — D649 Anemia, unspecified: Secondary | ICD-10-CM | POA: Diagnosis not present

## 2015-01-12 DIAGNOSIS — I10 Essential (primary) hypertension: Secondary | ICD-10-CM | POA: Diagnosis not present

## 2015-01-12 DIAGNOSIS — F329 Major depressive disorder, single episode, unspecified: Secondary | ICD-10-CM | POA: Diagnosis not present

## 2015-01-12 DIAGNOSIS — K5900 Constipation, unspecified: Secondary | ICD-10-CM | POA: Diagnosis not present

## 2015-01-12 DIAGNOSIS — E119 Type 2 diabetes mellitus without complications: Secondary | ICD-10-CM | POA: Diagnosis not present

## 2015-01-12 DIAGNOSIS — E785 Hyperlipidemia, unspecified: Secondary | ICD-10-CM | POA: Diagnosis not present

## 2015-01-12 DIAGNOSIS — N3281 Overactive bladder: Secondary | ICD-10-CM | POA: Diagnosis not present

## 2015-01-12 DIAGNOSIS — I4891 Unspecified atrial fibrillation: Secondary | ICD-10-CM | POA: Diagnosis not present

## 2015-01-12 DIAGNOSIS — Z8673 Personal history of transient ischemic attack (TIA), and cerebral infarction without residual deficits: Secondary | ICD-10-CM | POA: Diagnosis not present

## 2015-01-12 DIAGNOSIS — M25511 Pain in right shoulder: Secondary | ICD-10-CM | POA: Diagnosis not present

## 2015-02-12 ENCOUNTER — Encounter: Payer: Self-pay | Admitting: *Deleted

## 2015-02-16 ENCOUNTER — Telehealth: Payer: Self-pay | Admitting: Neurology

## 2015-02-16 NOTE — Telephone Encounter (Signed)
Patient's brother called stating he needs a letter to [employer] stating patient is not capable of taking care of his personal affairs. He needs two letters and he has gotten one from his PCP. He will bring copy of POA and of letter from PCP for Dr Krista Blue to see what he needs letter to say. Please call and advise. He can be reached at [phone]. He was advised Dr Krista Blue out of office until 02/23/15. He understood.

## 2015-02-16 NOTE — Telephone Encounter (Signed)
Spoke to [place] - he is bringing the [employer] and letter to our office for review on 02/17/15.

## 2015-02-17 ENCOUNTER — Other Ambulatory Visit: Payer: Self-pay | Admitting: *Deleted

## 2015-02-17 ENCOUNTER — Encounter: Payer: Self-pay | Admitting: *Deleted

## 2015-02-17 DIAGNOSIS — K625 Hemorrhage of anus and rectum: Secondary | ICD-10-CM | POA: Diagnosis not present

## 2015-02-17 DIAGNOSIS — K642 Third degree hemorrhoids: Secondary | ICD-10-CM | POA: Diagnosis not present

## 2015-02-17 DIAGNOSIS — R151 Fecal smearing: Secondary | ICD-10-CM | POA: Diagnosis not present

## 2015-02-17 NOTE — Telephone Encounter (Signed)
Letter complete - will return to medical records for documentation.

## 2015-02-18 ENCOUNTER — Telehealth: Payer: Self-pay | Admitting: *Deleted

## 2015-02-18 DIAGNOSIS — Z0289 Encounter for other administrative examinations: Secondary | ICD-10-CM

## 2015-02-18 NOTE — Telephone Encounter (Signed)
Patient letter is at the front desk.

## 2015-02-19 ENCOUNTER — Encounter: Payer: Self-pay | Admitting: Interventional Cardiology

## 2015-02-19 ENCOUNTER — Ambulatory Visit: Payer: Medicare Other | Admitting: Interventional Cardiology

## 2015-03-01 DIAGNOSIS — I4891 Unspecified atrial fibrillation: Secondary | ICD-10-CM | POA: Diagnosis not present

## 2015-03-01 DIAGNOSIS — Z7901 Long term (current) use of anticoagulants: Secondary | ICD-10-CM | POA: Diagnosis not present

## 2015-03-01 IMAGING — CT CT VIRTUAL COLONOSCOPY DIAGNOSTIC
3 of 7 series · 12 of 36 positions shown, 18 images · non-contrast
Comparison: None.

CLINICAL DATA: Rectal bleeding.

CT VIRTUAL COLONOSCOPY DIAGNOSTIC
TECHNIQUE: The patient was given a standard LoSo bowel
preparation with Gastrografin and barium for fluid and stool
tagging respectively.  The quality of the bowel preparation is
moderate.  Automated CO2 insufflation of the colon was performed
prior to image acquisition and colonic distention is generally
excellent.  Image post processing was used to generate a 3D
endoluminal fly-through projection of the colon and to
electronically subtract stool/fluid as appropriate.

[Series 6: prone (id) · axial · 0.83mm/px · z∈[-410,+23]mm · 8 of 446 slices shown, 13 images (1 of 2)]
[im 50/446  soft-tissue]
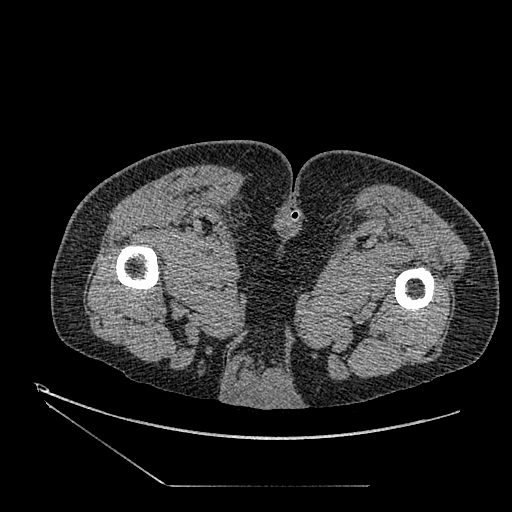
[im 50/446  bone]
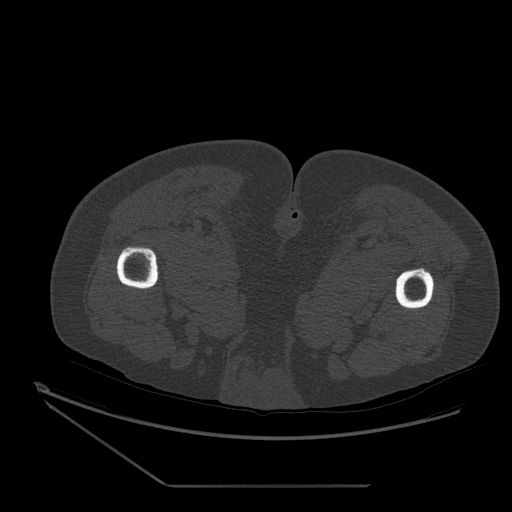
[im 99/446  soft-tissue]
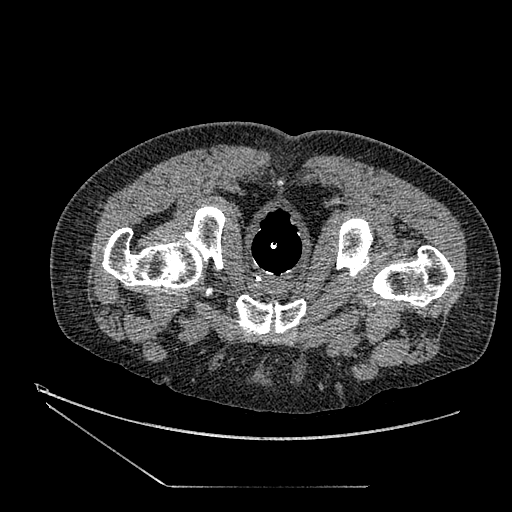
[im 149/446  soft-tissue]
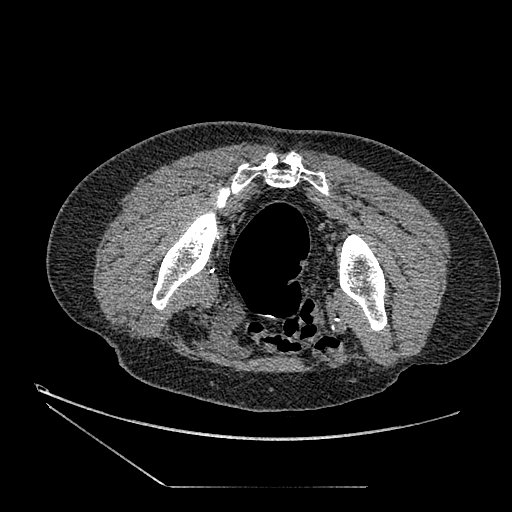
[im 198/446  soft-tissue]
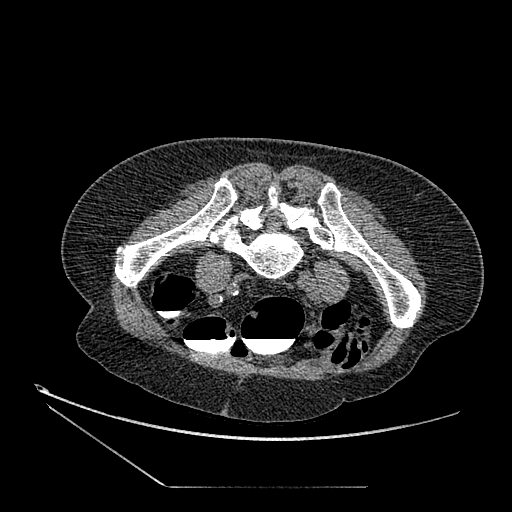
[im 248/446  soft-tissue]
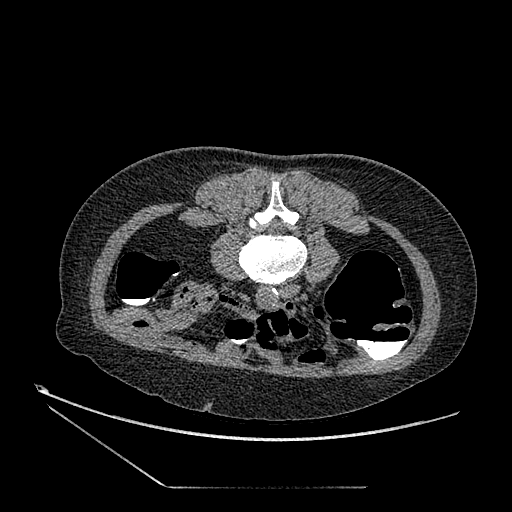
[im 248/446  lung]
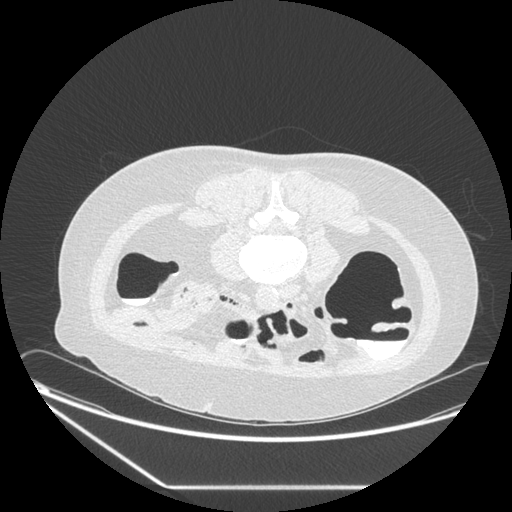
[im 297/446  soft-tissue]
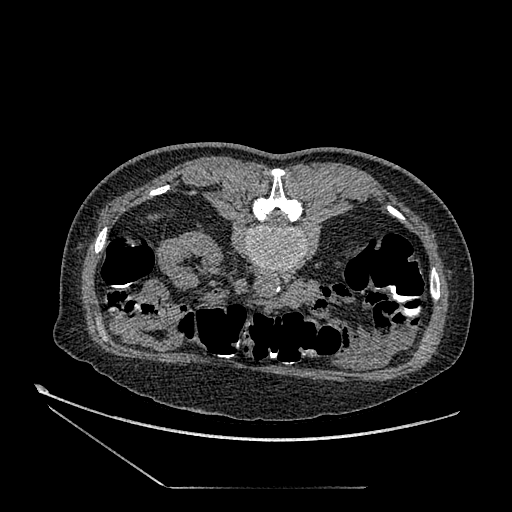
[im 297/446  lung]
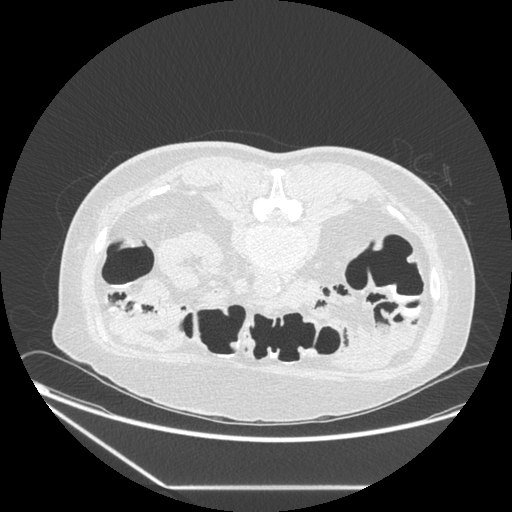
[im 347/446  soft-tissue]
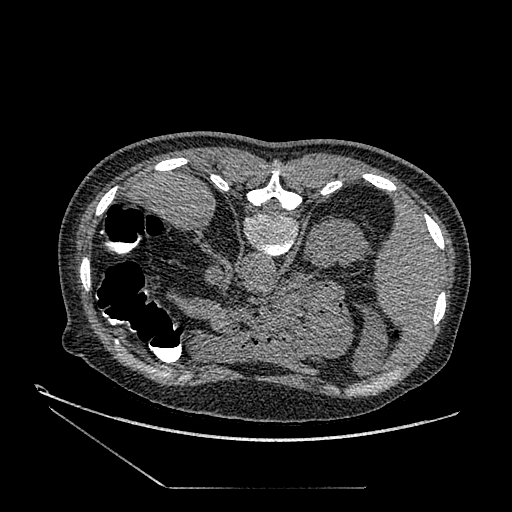
[im 347/446  lung]
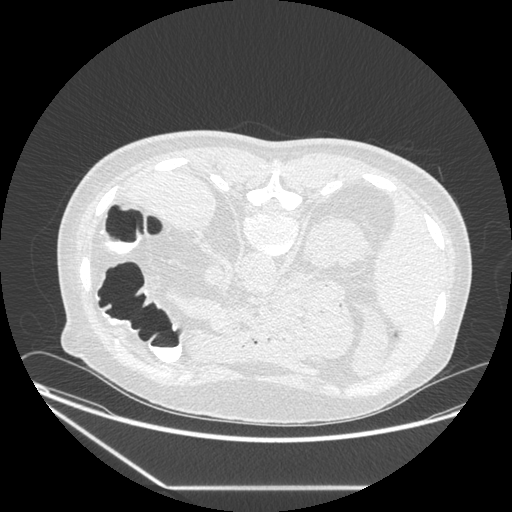
[im 396/446  soft-tissue]
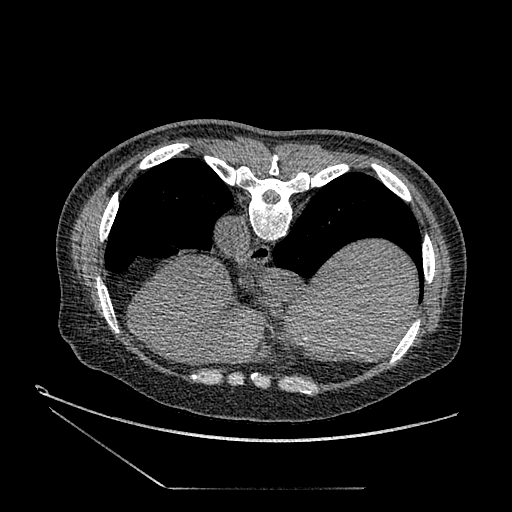
[im 396/446  lung]
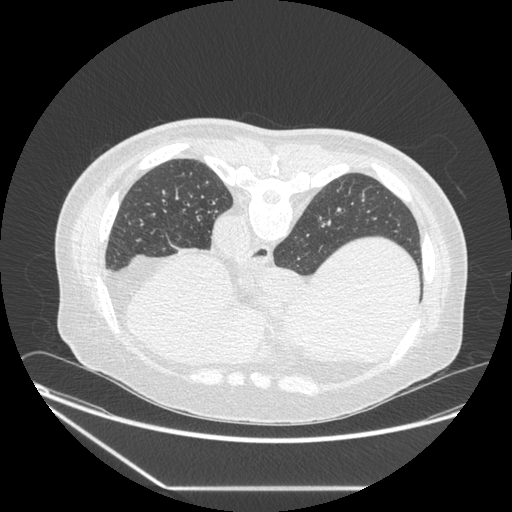

[Series 7: prone (id) · axial · 0.83mm/px · z∈[-332,-54]mm · 3 of 223 slices shown (2 of 2)]
[im 56/223  soft-tissue]
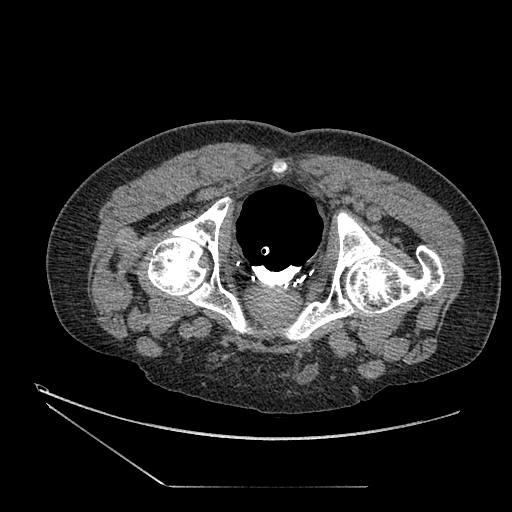
[im 112/223  soft-tissue]
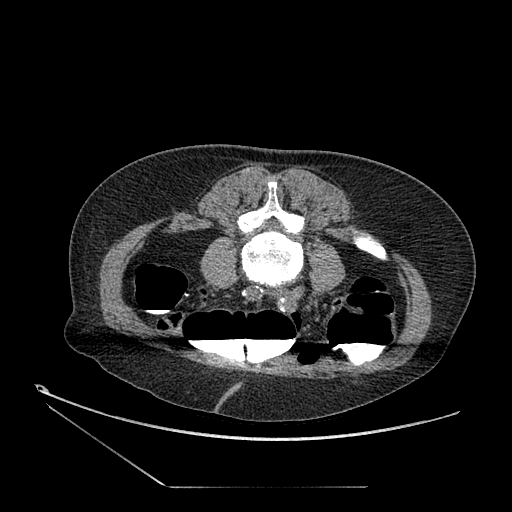
[im 167/223  soft-tissue]
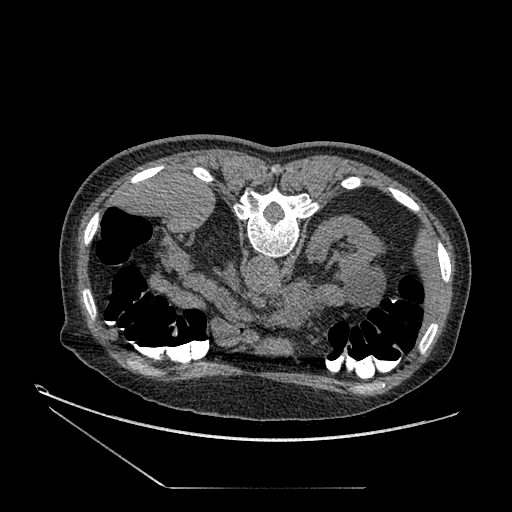

[Series 601: coronal body · coronal · 1.05mm/px · 1 of 116 slices shown, 2 images]
[im 39/116  soft-tissue]
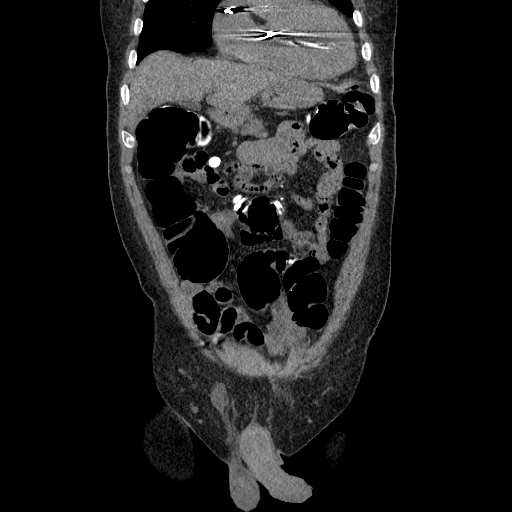
[im 39/116  bone]
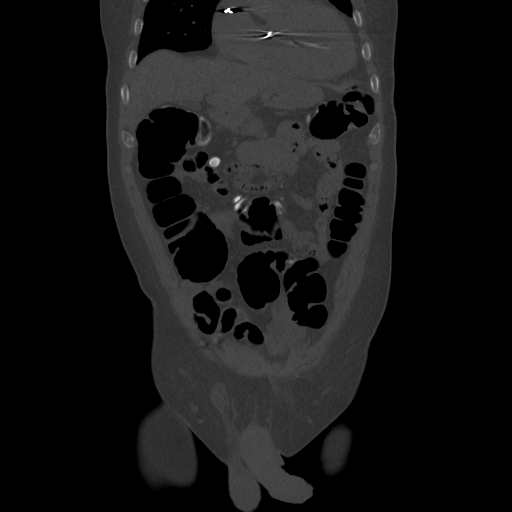

[12 of 36 positions shown; findings below may reference images not displayed]

FINDINGS: A possible 6-7 mm polyp is seen in the proximal ascending
colon, along the right posterolateral wall (supine series 2, image
158 and prone series 6, image 169).  On 3-D imaging, the lesion is
obscured by fluid on supine imaging.  On prone imaging, it is
located approximately 128 cm from the anal verge.  No mass or
stricture.  Note is made that the distal transverse colon is
suboptimally distended on both supine and prone imaging, limiting
evaluation.
IMPRESSION: 1.  Possible small polyp in the proximal ascending colon.
2.  Distal transverse colon is suboptimally evaluated due to areas
of poor distention.

Virtual colonoscopy is not designed to detect diminutive polyps
(i.e., less than or equal to 5 mm), the presence or absence of
which may not affect clinical management

CT ABDOMEN AND PELVIS WITHOUT CONTRAST
FINDINGS: Lung bases show no acute findings.  Heart is mildly
enlarged.  No pericardial or pleural effusion.  Small hiatal
hernia.

Liver, gallbladder and adrenal glands are unremarkable.  A low
attenuation lesion off the interpolar right kidney measures 3.5 cm.
There are additional smaller low attenuation lesions in the right
kidney.  Definitive evaluation is difficult without IV contrast.
Spleen, pancreas, stomach and small bowel are unremarkable.  Colon
is discussed in the virtual colonoscopy section of this report.
Atherosclerotic calcification of the arterial vasculature.

Surgical clips are seen in the expected location of the prostate.
Small right inguinal hernia contains fat.  No pathologically
enlarged lymph nodes.  No free fluid.  No worrisome lytic or
sclerotic lesions.  Degenerative changes are seen in the spine.
IMPRESSION: No acute findings.

## 2015-03-03 ENCOUNTER — Ambulatory Visit (INDEPENDENT_AMBULATORY_CARE_PROVIDER_SITE_OTHER): Payer: Medicare Other | Admitting: Physician Assistant

## 2015-03-03 ENCOUNTER — Encounter: Payer: Self-pay | Admitting: Physician Assistant

## 2015-03-03 VITALS — BP 122/80 | HR 79 | Ht 68.0 in | Wt 168.0 lb

## 2015-03-03 DIAGNOSIS — Z7901 Long term (current) use of anticoagulants: Secondary | ICD-10-CM

## 2015-03-03 DIAGNOSIS — E785 Hyperlipidemia, unspecified: Secondary | ICD-10-CM

## 2015-03-03 DIAGNOSIS — Z5181 Encounter for therapeutic drug level monitoring: Secondary | ICD-10-CM

## 2015-03-03 DIAGNOSIS — I639 Cerebral infarction, unspecified: Secondary | ICD-10-CM

## 2015-03-03 DIAGNOSIS — I482 Chronic atrial fibrillation, unspecified: Secondary | ICD-10-CM

## 2015-03-03 DIAGNOSIS — K625 Hemorrhage of anus and rectum: Secondary | ICD-10-CM | POA: Diagnosis not present

## 2015-03-03 DIAGNOSIS — I10 Essential (primary) hypertension: Secondary | ICD-10-CM | POA: Diagnosis not present

## 2015-03-03 DIAGNOSIS — R609 Edema, unspecified: Secondary | ICD-10-CM

## 2015-03-03 DIAGNOSIS — R0602 Shortness of breath: Secondary | ICD-10-CM

## 2015-03-03 DIAGNOSIS — Z950 Presence of cardiac pacemaker: Secondary | ICD-10-CM

## 2015-03-03 LAB — CBC WITH DIFFERENTIAL/PLATELET
BASOS PCT: 0.5 % (ref 0.0–3.0)
Basophils Absolute: 0 10*3/uL (ref 0.0–0.1)
EOS PCT: 3.7 % (ref 0.0–5.0)
Eosinophils Absolute: 0.2 10*3/uL (ref 0.0–0.7)
HCT: 36.8 % — ABNORMAL LOW (ref 39.0–52.0)
HEMOGLOBIN: 12 g/dL — AB (ref 13.0–17.0)
Lymphocytes Relative: 17 % (ref 12.0–46.0)
Lymphs Abs: 0.9 10*3/uL (ref 0.7–4.0)
MCHC: 32.6 g/dL (ref 30.0–36.0)
MCV: 90.6 fl (ref 78.0–100.0)
MONO ABS: 0.5 10*3/uL (ref 0.1–1.0)
Monocytes Relative: 9.2 % (ref 3.0–12.0)
NEUTROS ABS: 3.6 10*3/uL (ref 1.4–7.7)
Neutrophils Relative %: 69.6 % (ref 43.0–77.0)
Platelets: 181 10*3/uL (ref 150.0–400.0)
RBC: 4.06 Mil/uL — ABNORMAL LOW (ref 4.22–5.81)
RDW: 14.7 % (ref 11.5–15.5)
WBC: 5.1 10*3/uL (ref 4.0–10.5)

## 2015-03-03 LAB — BASIC METABOLIC PANEL
BUN: 20 mg/dL (ref 6–23)
CO2: 29 meq/L (ref 19–32)
Calcium: 9 mg/dL (ref 8.4–10.5)
Chloride: 109 mEq/L (ref 96–112)
Creatinine, Ser: 1.13 mg/dL (ref 0.40–1.50)
GFR: 80.88 mL/min (ref >60.00)
Glucose, Bld: 82 mg/dL (ref 70–99)
POTASSIUM: 4 meq/L (ref 3.5–5.1)
Sodium: 143 mEq/L (ref 135–145)

## 2015-03-03 LAB — BRAIN NATRIURETIC PEPTIDE: PRO B NATRI PEPTIDE: 170 pg/mL — AB (ref 0.0–100.0)

## 2015-03-03 NOTE — Patient Instructions (Addendum)
Medication Instructions:  Your physician recommends that you continue on your current medications as directed. Please refer to the Current Medication list given to you today.   Labwork: TODAY BMET, CBC W/DIFF, BNP  Testing/Procedures: NONE  Follow-Up: 1. KEEP YOUR APPT ON 03/08/15 TO SEE DR. Lovena Le  2. Your physician wants you to follow-up in: [place] DR. VARANASI.  You will receive a reminder letter in the mail two months in advance. If you don't receive a letter, please call our office to schedule the follow-up appointment.  Any Other Special Instructions Will Be Listed Below (If Applicable). 1. KEEP YOUR LEGS ELEVATED WHEN SITTING; PER SCOTT WEAVER, PAC TO PICK UP OTC COMPRESSION STOCKINGS AND WEAR FROM MORNING UNTIL BEDTIME  2. PER SCOTT WEAVER, PAC TO CALL YOUR PRIMARY CARE PHYSICIAN FOR THE RECTAL BLEEDING

## 2015-03-03 NOTE — Progress Notes (Signed)
Cardiology Office Note   Date:  03/03/2015   ID:  Brent Dunn, DOB 04-14-1936, MRN [id number]  PCP:  Gara Kroner, MD  Cardiologist:  Dr. Casandra Doffing   Electrophysiologist:  Dr. Cristopher Peru   Chief Complaint  Patient presents with  . Atrial Fibrillation     History of Present Illness: Brent Dunn is a 79 y.o. male with a hx of DM2, HTN, HL, chronic AFib, prior CVA 3/14 with subsequent seizures, Coumadin anticoagulation, symptomatic bradycardia s/p PPM.  Moved to [place] from [place] in 2014.  Last seen by Dr. Casandra Doffing 12/2013.  Here for follow-up. His sister provides the history. There has been no recent history of chest pain or syncope. Denies orthopnea, PND. Patient has noted LE edema. Difficult to know from history if he has been short of breath. Denies coughing or wheezing.   Studies/Reports Reviewed Today:  Echo 01/24/13 Moderate LVH, EF 55-60%, moderate LAE, mild RAE, trivial effusion    Past Medical History  Diagnosis Date  . Stroke   . Atrial fibrillation   . High blood pressure   . Depression   . Diabetes mellitus type 2, controlled   . High cholesterol   . History of stroke   . Dementia     Past Surgical History  Procedure Laterality Date  . Appendectomy    . Pacemaker insertion       Current Outpatient Prescriptions  Medication Sig Dispense Refill  . acetaminophen (TYLENOL) 650 MG CR tablet Take 650 mg by mouth every 8 (eight) hours as needed for pain.    Marland Kitchen amLODipine (NORVASC) 2.5 MG tablet Take 2.5 mg by mouth daily.    Marland Kitchen atorvastatin (LIPITOR) 40 MG tablet Take 40 mg by mouth daily.    Marland Kitchen BAYER CONTOUR TEST test strip   1  . donepezil (ARICEPT) 10 MG tablet Take 1 tablet (10 mg total) by mouth at bedtime. 30 tablet 11  . escitalopram (LEXAPRO) 10 MG tablet Take 10 mg by mouth daily.    . folic acid (FOLVITE) 1 MG tablet Take 1 mg by mouth daily.    Marland Kitchen levETIRAcetam (KEPPRA) 500 MG tablet Take 1 tablet (500 mg total) by mouth every 12 (twelve) hours.  180 tablet 1  . lisinopril (PRINIVIL,ZESTRIL) 20 MG tablet Take 20 mg by mouth daily.    . metformin (FORTAMET) 500 MG (OSM) 24 hr tablet Take 500 mg by mouth daily.     . metFORMIN (GLUCOPHAGE) 500 MG tablet Take 500 mg by mouth daily.  5  . metoprolol (LOPRESSOR) 50 MG tablet Take 50 mg by mouth 2 (two) times daily.     Marland Kitchen warfarin (COUMADIN) 6 MG tablet Take as directed by the coumadin clinic    . MYRBETRIQ 50 MG TB24 tablet Take 50 mg by mouth daily.     . TOVIAZ 8 MG TB24 tablet Take 8 mg by mouth daily.      No current facility-administered medications for this visit.    Allergies:   Review of patient's allergies indicates no known allergies.    Social History:  The patient  reports that he has quit smoking. His smoking use included Cigarettes. He has never used smokeless tobacco. He reports that he does not drink alcohol or use illicit drugs.   Family History:  The patient's family history includes Cancer in his brother; Diabetes in his sister; High blood pressure in his father; Stroke in his father and mother.    ROS:   Please see  the history of present illness.   Review of Systems  Constitution: Positive for malaise/fatigue.  Gastrointestinal: Positive for hematochezia.  All other systems reviewed and are negative.     PHYSICAL EXAM: VS:  BP 122/80 mmHg  Pulse 79  Ht 5\' 8"  (1.727 m)  Wt 168 lb (76.204 kg)  BMI 25.55 kg/m2  SpO2 97%    Wt Readings from Last 3 Encounters:  03/03/15 168 lb (76.204 kg)  01/07/15 165 lb (74.844 kg)  06/29/14 164 lb (74.39 kg)     GEN: Well nourished, well developed, in no acute distress HEENT: normal Neck: no JVD, no masses Cardiac:  Normal S1/S2, irregularly irregular rhythm; no murmur ,  no rubs or gallops, trace-1+ bilateral ankle edema Respiratory:   bibasilar crackles, no wheezing, no rhonchi. GI: soft, nontender, nondistended, + BS MS: no deformity or atrophy Skin: warm and dry  Neuro:  CNs II-XII intact, Strength and  sensation are intact Psych: Normal affect   EKG:  EKG is ordered today.  It demonstrates:   AFlutter, HR 79, LAD, RBBB, no change from prior tracing.   Recent Labs: 07/23/2014: ALT 17; BUN 15; Creatinine, Ser 1.23; Hemoglobin 12.5*; Platelets 230; Potassium 4.6; Sodium 141    Lipid Panel No results found for: CHOL, TRIG, HDL, CHOLHDL, VLDL, LDLCALC, LDLDIRECT    ASSESSMENT AND PLAN:    Chronic Atrial Fibrillation:  ECG shows flutter today.  Rate is controlled.  Coumadin is managed by PCP.  Continue current Rx.   HTN:  Controlled.   Hyperlipidemia:  Continue statin.  Followed by PCP.   Prior CVA:  FU with neurology as planned.  Coumadin Rx:  Managed by PCP   S/p Pacemaker:  FU with EP as planned.  Rectal Bleeding:  Patient here with his sister today.  She is not his primary caregiver.  There have been symptoms of rectal bleeding recently.  Details are not clear.  Will get BMET, CBC.  I have asked the patient to FU with his PCP for this problem.    Edema:  I suspect this is related to venous insufficiency.  It is not clear if he has been short of breath.  Neck veins are flat.  He does not take a full breath on exam and there are crackles in his bases.  I will get a BMET, BNP.  If BNP elevated, start Lasix and get a FU echo and CXR.  Otherwise, he can keep legs elevated and wear OTC compression stockings.       Medication Changes: Current medicines are reviewed at length with the patient today.  Concerns regarding medicines are as outlined above.  The following changes have been made:   Discontinued Medications   MEMANTINE (NAMENDA) 10 MG TABLET    Take 1 tablet (10 mg total) by mouth 2 (two) times daily.   Modified Medications   No medications on file   New Prescriptions   No medications on file    Labs/ tests ordered today include:   Orders Placed This Encounter  Procedures  . Basic Metabolic Panel (BMET)  . CBC w/Diff  . B Nat Peptide  . EKG 12-Lead       Disposition:    FU with Dr. Casandra Doffing 6 mos.   Signed, Versie Starks, MHS 03/03/2015 3:00 PM    [facility] [address], [place], [place]  [address] Phone: [phone]; Fax: [phone]

## 2015-03-08 ENCOUNTER — Ambulatory Visit (INDEPENDENT_AMBULATORY_CARE_PROVIDER_SITE_OTHER): Payer: Medicare Other | Admitting: *Deleted

## 2015-03-08 ENCOUNTER — Telehealth: Payer: Self-pay | Admitting: *Deleted

## 2015-03-08 DIAGNOSIS — I482 Chronic atrial fibrillation, unspecified: Secondary | ICD-10-CM

## 2015-03-08 NOTE — Telephone Encounter (Signed)
I lmptcb x 3. I s/w DPR and advised of lab results by phone and recommendations. DPR verbalizedx understanding to plan of care for pt and said thank you.

## 2015-03-10 LAB — CUP PACEART INCLINIC DEVICE CHECK
Lead Channel Impedance Value: 970 Ohm
Lead Channel Pacing Threshold Amplitude: 0.8 V
Lead Channel Pacing Threshold Amplitude: 0.8 V
Lead Channel Pacing Threshold Pulse Width: 0.4 ms
Lead Channel Pacing Threshold Pulse Width: 0.4 ms
Lead Channel Setting Pacing Amplitude: 2.4 V
Lead Channel Setting Pacing Pulse Width: 0.5 ms
Lead Channel Setting Sensing Sensitivity: 2.5 mV
MDC IDC MSMT LEADCHNL RV SENSING INTR AMPL: 8.9 mV
MDC IDC SESS DTM: 20160919040000
MDC IDC STAT BRADY RV PERCENT PACED: 29 %
Pulse Gen Model: 1198
Pulse Gen Serial Number: 130187

## 2015-03-10 NOTE — Progress Notes (Signed)
Pacemaker check in clinic. Normal device function. Sensing and impedance consistent with previous measurements. Device programmed to maximize longevity. Permanent AF + Warfarin. (6) high ventricular rates noted---no EGMs due to ERI. Device programmed at appropriate safety margins. Histogram distribution appropriate for patient activity level. Device programmed to optimize intrinsic conduction. ERI reached on 02/04/2015. Patient will follow up with GT on 10/4 @ 1230.

## 2015-03-16 DIAGNOSIS — N3941 Urge incontinence: Secondary | ICD-10-CM | POA: Diagnosis not present

## 2015-03-16 DIAGNOSIS — N3944 Nocturnal enuresis: Secondary | ICD-10-CM | POA: Diagnosis not present

## 2015-03-23 ENCOUNTER — Encounter: Payer: Medicare Other | Admitting: Internal Medicine

## 2015-03-23 DIAGNOSIS — R2689 Other abnormalities of gait and mobility: Secondary | ICD-10-CM | POA: Diagnosis not present

## 2015-03-23 DIAGNOSIS — R29898 Other symptoms and signs involving the musculoskeletal system: Secondary | ICD-10-CM | POA: Diagnosis not present

## 2015-03-23 DIAGNOSIS — M25511 Pain in right shoulder: Secondary | ICD-10-CM | POA: Diagnosis not present

## 2015-03-23 DIAGNOSIS — I69398 Other sequelae of cerebral infarction: Secondary | ICD-10-CM | POA: Diagnosis not present

## 2015-03-31 DIAGNOSIS — R29898 Other symptoms and signs involving the musculoskeletal system: Secondary | ICD-10-CM | POA: Diagnosis not present

## 2015-03-31 DIAGNOSIS — I69398 Other sequelae of cerebral infarction: Secondary | ICD-10-CM | POA: Diagnosis not present

## 2015-03-31 DIAGNOSIS — R2689 Other abnormalities of gait and mobility: Secondary | ICD-10-CM | POA: Diagnosis not present

## 2015-03-31 DIAGNOSIS — M25511 Pain in right shoulder: Secondary | ICD-10-CM | POA: Diagnosis not present

## 2015-04-02 ENCOUNTER — Other Ambulatory Visit: Payer: Self-pay | Admitting: Neurology

## 2015-04-06 ENCOUNTER — Encounter: Payer: Self-pay | Admitting: Internal Medicine

## 2015-04-07 DIAGNOSIS — R2689 Other abnormalities of gait and mobility: Secondary | ICD-10-CM | POA: Diagnosis not present

## 2015-04-07 DIAGNOSIS — I69398 Other sequelae of cerebral infarction: Secondary | ICD-10-CM | POA: Diagnosis not present

## 2015-04-07 DIAGNOSIS — M25511 Pain in right shoulder: Secondary | ICD-10-CM | POA: Diagnosis not present

## 2015-04-07 DIAGNOSIS — R29898 Other symptoms and signs involving the musculoskeletal system: Secondary | ICD-10-CM | POA: Diagnosis not present

## 2015-04-09 DIAGNOSIS — M25511 Pain in right shoulder: Secondary | ICD-10-CM | POA: Diagnosis not present

## 2015-04-09 DIAGNOSIS — R29898 Other symptoms and signs involving the musculoskeletal system: Secondary | ICD-10-CM | POA: Diagnosis not present

## 2015-04-09 DIAGNOSIS — I69398 Other sequelae of cerebral infarction: Secondary | ICD-10-CM | POA: Diagnosis not present

## 2015-04-09 DIAGNOSIS — R2689 Other abnormalities of gait and mobility: Secondary | ICD-10-CM | POA: Diagnosis not present

## 2015-04-13 DIAGNOSIS — I4891 Unspecified atrial fibrillation: Secondary | ICD-10-CM | POA: Diagnosis not present

## 2015-04-13 DIAGNOSIS — Z1389 Encounter for screening for other disorder: Secondary | ICD-10-CM | POA: Diagnosis not present

## 2015-04-13 DIAGNOSIS — K5900 Constipation, unspecified: Secondary | ICD-10-CM | POA: Diagnosis not present

## 2015-04-13 DIAGNOSIS — D649 Anemia, unspecified: Secondary | ICD-10-CM | POA: Diagnosis not present

## 2015-04-13 DIAGNOSIS — I10 Essential (primary) hypertension: Secondary | ICD-10-CM | POA: Diagnosis not present

## 2015-04-13 DIAGNOSIS — N3281 Overactive bladder: Secondary | ICD-10-CM | POA: Diagnosis not present

## 2015-04-13 DIAGNOSIS — Z8673 Personal history of transient ischemic attack (TIA), and cerebral infarction without residual deficits: Secondary | ICD-10-CM | POA: Diagnosis not present

## 2015-04-13 DIAGNOSIS — Z23 Encounter for immunization: Secondary | ICD-10-CM | POA: Diagnosis not present

## 2015-04-13 DIAGNOSIS — E119 Type 2 diabetes mellitus without complications: Secondary | ICD-10-CM | POA: Diagnosis not present

## 2015-04-13 DIAGNOSIS — K649 Unspecified hemorrhoids: Secondary | ICD-10-CM | POA: Diagnosis not present

## 2015-04-13 DIAGNOSIS — E785 Hyperlipidemia, unspecified: Secondary | ICD-10-CM | POA: Diagnosis not present

## 2015-04-13 DIAGNOSIS — R400 Somnolence: Secondary | ICD-10-CM | POA: Diagnosis not present

## 2015-04-20 DIAGNOSIS — M25511 Pain in right shoulder: Secondary | ICD-10-CM | POA: Diagnosis not present

## 2015-04-20 DIAGNOSIS — I69398 Other sequelae of cerebral infarction: Secondary | ICD-10-CM | POA: Diagnosis not present

## 2015-04-20 DIAGNOSIS — R2689 Other abnormalities of gait and mobility: Secondary | ICD-10-CM | POA: Diagnosis not present

## 2015-04-20 DIAGNOSIS — R29898 Other symptoms and signs involving the musculoskeletal system: Secondary | ICD-10-CM | POA: Diagnosis not present

## 2015-04-26 ENCOUNTER — Encounter: Payer: Self-pay | Admitting: *Deleted

## 2015-04-26 ENCOUNTER — Ambulatory Visit (INDEPENDENT_AMBULATORY_CARE_PROVIDER_SITE_OTHER): Payer: Medicare Other | Admitting: Internal Medicine

## 2015-04-26 ENCOUNTER — Encounter: Payer: Self-pay | Admitting: Internal Medicine

## 2015-04-26 VITALS — BP 118/84 | HR 82 | Ht 68.0 in | Wt 169.8 lb

## 2015-04-26 DIAGNOSIS — Z01812 Encounter for preprocedural laboratory examination: Secondary | ICD-10-CM | POA: Diagnosis not present

## 2015-04-26 DIAGNOSIS — Z950 Presence of cardiac pacemaker: Secondary | ICD-10-CM

## 2015-04-26 DIAGNOSIS — I639 Cerebral infarction, unspecified: Secondary | ICD-10-CM

## 2015-04-26 DIAGNOSIS — I4891 Unspecified atrial fibrillation: Secondary | ICD-10-CM | POA: Diagnosis not present

## 2015-04-26 DIAGNOSIS — I10 Essential (primary) hypertension: Secondary | ICD-10-CM

## 2015-04-26 DIAGNOSIS — I482 Chronic atrial fibrillation, unspecified: Secondary | ICD-10-CM

## 2015-04-26 LAB — CBC WITH DIFFERENTIAL/PLATELET
BASOS ABS: 0 10*3/uL (ref 0.0–0.1)
Basophils Relative: 0 % (ref 0–1)
Eosinophils Absolute: 0.2 10*3/uL (ref 0.0–0.7)
Eosinophils Relative: 4 % (ref 0–5)
HEMATOCRIT: 38 % — AB (ref 39.0–52.0)
HEMOGLOBIN: 12.4 g/dL — AB (ref 13.0–17.0)
LYMPHS PCT: 16 % (ref 12–46)
Lymphs Abs: 1 10*3/uL (ref 0.7–4.0)
MCH: 28.7 pg (ref 26.0–34.0)
MCHC: 32.6 g/dL (ref 30.0–36.0)
MCV: 88 fL (ref 78.0–100.0)
MPV: 10.5 fL (ref 8.6–12.4)
Monocytes Absolute: 0.5 10*3/uL (ref 0.1–1.0)
Monocytes Relative: 8 % (ref 3–12)
NEUTROS ABS: 4.4 10*3/uL (ref 1.7–7.7)
Neutrophils Relative %: 72 % (ref 43–77)
Platelets: 226 10*3/uL (ref 150–400)
RBC: 4.32 MIL/uL (ref 4.22–5.81)
RDW: 13.8 % (ref 11.5–15.5)
WBC: 6.1 10*3/uL (ref 4.0–10.5)

## 2015-04-26 LAB — BASIC METABOLIC PANEL
BUN: 13 mg/dL (ref 7–25)
CO2: 26 mmol/L (ref 20–31)
Calcium: 8.8 mg/dL (ref 8.6–10.3)
Chloride: 105 mmol/L (ref 98–110)
Creat: 1.06 mg/dL (ref 0.70–1.18)
Glucose, Bld: 69 mg/dL (ref 65–99)
Potassium: 4.5 mmol/L (ref 3.5–5.3)
SODIUM: 143 mmol/L (ref 135–146)

## 2015-04-26 NOTE — Patient Instructions (Signed)
Medication Instructions:  Your physician recommends that you continue on your current medications as directed. Please refer to the Current Medication list given to you today.  Labwork: Pre procedure labs today: BMET, CBCD, INR  Testing/Procedures: Your physician has recommended that you have a defibrillator battery change.  Please see the instruction sheet given to you today for more information.  Follow-Up: Your physician recommends that you schedule a follow-up appointment in: 4 weeks, with device clinic, after your procedure on 04/27/15.  Your physician recommends that you schedule a follow-up appointment in: 3 months, with Dr. Lovena Le, after your procedure on 04/27/15.  Any Other Special Instructions Will Be Listed Below (If Applicable).  If you need a refill on your cardiac medications before your next appointment, please call your pharmacy.  Thank you for choosing [facility]!!

## 2015-04-26 NOTE — Progress Notes (Signed)
HPI [name] returns today for ongoing evaluation of his PPM. He is a pleasant 79 yo man with a h/o chronic atrial fibrillaion, symptomatic bradycardia and is s/p PPM insertion. He moved from [place] to [place] approximately a year ago to be closer to family. He has a remote stroke and a right HP. He has decreased vision from he stroke. He has chronic dyspnea which is class 2. No syncope or chest pain. He has reached ERI on his PPM. No Known Allergies   Current Outpatient Prescriptions  Medication Sig Dispense Refill  . acetaminophen (TYLENOL) 650 MG CR tablet Take 650 mg by mouth every 8 (eight) hours as needed for pain.    Marland Kitchen amLODipine (NORVASC) 2.5 MG tablet Take 2.5 mg by mouth daily.    Marland Kitchen atorvastatin (LIPITOR) 40 MG tablet Take 40 mg by mouth daily.    Marland Kitchen BAYER CONTOUR TEST test strip   1  . donepezil (ARICEPT) 10 MG tablet TAKE 1 TABLET AT BEDTIME 90 tablet 0  . escitalopram (LEXAPRO) 10 MG tablet Take 10 mg by mouth daily.    . folic acid (FOLVITE) 1 MG tablet Take 1 mg by mouth daily.    Marland Kitchen levETIRAcetam (KEPPRA) 500 MG tablet Take 1 tablet (500 mg total) by mouth every 12 (twelve) hours. 180 tablet 1  . lisinopril (PRINIVIL,ZESTRIL) 20 MG tablet Take 20 mg by mouth daily.    . metformin (FORTAMET) 500 MG (OSM) 24 hr tablet Take 500 mg by mouth daily.     . metoprolol (LOPRESSOR) 50 MG tablet Take 50 mg by mouth 2 (two) times daily.     Marland Kitchen MYRBETRIQ 50 MG TB24 tablet Take 50 mg by mouth daily.     . TOVIAZ 8 MG TB24 tablet Take 8 mg by mouth daily.     Marland Kitchen warfarin (COUMADIN) 6 MG tablet Take as directed by the coumadin clinic     No current facility-administered medications for this visit.     Past Medical History  Diagnosis Date  . Stroke ([place])   . Atrial fibrillation ([place])   . High blood pressure   . Depression   . Diabetes mellitus type 2, controlled ([place])   . High cholesterol   . History of stroke   . Dementia     ROS:   All systems reviewed and negative  except as noted in the HPI.   Past Surgical History  Procedure Laterality Date  . Appendectomy    . Pacemaker insertion       Family History  Problem Relation Age of Onset  . Stroke Father   . High blood pressure Father   . Stroke Mother   . Diabetes Sister   . Cancer Brother     colon     Social History   Social History  . Marital Status: Single    Spouse Name: N/A  . Number of Children: 2  . Years of Education: 12   Occupational History  .      Retired   Social History Main Topics  . Smoking status: Former Smoker    Types: Cigarettes  . Smokeless tobacco: Never Used     Comment: 1980's  . Alcohol Use: No  . Drug Use: No  . Sexual Activity: Not on file   Other Topics Concern  . Not on file   Social History Narrative   Patient  Lives at home with his brother  Brent Dunn) Patient is retired. Patient has  a 12th grade education. Right handed.   Caffeine - None      Sister Brent Dunn - [phone]   Brent Dunn Brother- [phone]     BP 118/84 mmHg  Pulse 82  Ht 5\' 8"  (1.727 m)  Wt 169 lb 12.8 oz (77.021 kg)  BMI 25.82 kg/m2  SpO2 96%  Physical Exam:  Well appearing 79 yo man, NAD HEENT: Unremarkable Neck:  6 cm JVD, no thyromegally Back:  No CVA tenderness Lungs:  Clear with no wheezes HEART:  IRegular rate rhythm, no murmurs, no rubs, no clicks Abd:  soft, positive bowel sounds, no organomegally, no rebound, no guarding Ext:  2 plus pulses, no edema, no cyanosis, no clubbing Skin:  No rashes no nodules Neuro:  CN II through XII intact, motor grossly intact  DEVICE  Normal device function.  See PaceArt for details. Device at [facility] for 10 weeks.  Assess/Plan:

## 2015-04-26 NOTE — Assessment & Plan Note (Signed)
His blood pressure is well controlled. Will follow. 

## 2015-04-26 NOTE — Assessment & Plan Note (Signed)
His device is past ERI. Will schedule generator change.

## 2015-04-26 NOTE — Assessment & Plan Note (Signed)
His ventricular rate is well controlled. No change in meds. 

## 2015-04-27 ENCOUNTER — Ambulatory Visit (HOSPITAL_COMMUNITY)
Admission: RE | Admit: 2015-04-27 | Discharge: 2015-04-27 | Disposition: A | Discharge status: Home / Self Care | Payer: Medicare Other | Source: Ambulatory Visit | Attending: Internal Medicine | Admitting: Internal Medicine

## 2015-04-27 ENCOUNTER — Encounter (HOSPITAL_COMMUNITY)
Admission: RE | Disposition: A | Discharge status: Home / Self Care | Payer: Medicare Other | Source: Ambulatory Visit | Attending: Internal Medicine

## 2015-04-27 DIAGNOSIS — I4891 Unspecified atrial fibrillation: Secondary | ICD-10-CM | POA: Diagnosis present

## 2015-04-27 DIAGNOSIS — Z7901 Long term (current) use of anticoagulants: Secondary | ICD-10-CM | POA: Diagnosis not present

## 2015-04-27 DIAGNOSIS — Z7984 Long term (current) use of oral hypoglycemic drugs: Secondary | ICD-10-CM | POA: Insufficient documentation

## 2015-04-27 DIAGNOSIS — F329 Major depressive disorder, single episode, unspecified: Secondary | ICD-10-CM | POA: Diagnosis not present

## 2015-04-27 DIAGNOSIS — Z45010 Encounter for checking and testing of cardiac pacemaker pulse generator [battery]: Secondary | ICD-10-CM | POA: Insufficient documentation

## 2015-04-27 DIAGNOSIS — F0390 Unspecified dementia without behavioral disturbance: Secondary | ICD-10-CM | POA: Insufficient documentation

## 2015-04-27 DIAGNOSIS — Z950 Presence of cardiac pacemaker: Secondary | ICD-10-CM

## 2015-04-27 DIAGNOSIS — I482 Chronic atrial fibrillation, unspecified: Secondary | ICD-10-CM

## 2015-04-27 DIAGNOSIS — E7800 Pure hypercholesterolemia, unspecified: Secondary | ICD-10-CM | POA: Diagnosis not present

## 2015-04-27 DIAGNOSIS — Z8673 Personal history of transient ischemic attack (TIA), and cerebral infarction without residual deficits: Secondary | ICD-10-CM | POA: Diagnosis not present

## 2015-04-27 DIAGNOSIS — Z01812 Encounter for preprocedural laboratory examination: Secondary | ICD-10-CM

## 2015-04-27 DIAGNOSIS — E119 Type 2 diabetes mellitus without complications: Secondary | ICD-10-CM | POA: Diagnosis not present

## 2015-04-27 DIAGNOSIS — I10 Essential (primary) hypertension: Secondary | ICD-10-CM

## 2015-04-27 DIAGNOSIS — Z87891 Personal history of nicotine dependence: Secondary | ICD-10-CM | POA: Insufficient documentation

## 2015-04-27 DIAGNOSIS — I442 Atrioventricular block, complete: Secondary | ICD-10-CM | POA: Diagnosis not present

## 2015-04-27 HISTORY — PX: EP IMPLANTABLE DEVICE: SHX172B

## 2015-04-27 LAB — SURGICAL PCR SCREEN
MRSA, PCR: NEGATIVE
STAPHYLOCOCCUS AUREUS: NEGATIVE

## 2015-04-27 LAB — PROTIME-INR
INR: 1.53 — ABNORMAL HIGH (ref <1.50)
INR: 1.68 — ABNORMAL HIGH (ref 0.00–1.49)
Prothrombin Time: 18.6 seconds — ABNORMAL HIGH (ref 11.6–15.2)
Prothrombin Time: 19.8 seconds — ABNORMAL HIGH (ref 11.6–15.2)

## 2015-04-27 SURGERY — PPM/BIV PPM GENERATOR CHANGEOUT

## 2015-04-27 MED ORDER — ONDANSETRON HCL 4 MG/2ML IJ SOLN: 4.0000 mg | Freq: Four times a day (QID) | INTRAMUSCULAR | Status: DC | PRN

## 2015-04-27 MED ORDER — MUPIROCIN 2 % EX OINT: TOPICAL_OINTMENT | Freq: Two times a day (BID) | CUTANEOUS | Status: DC

## 2015-04-27 MED ORDER — LIDOCAINE HCL (PF) 1 % IJ SOLN
INTRAMUSCULAR | Status: AC
  Filled 2015-04-27: qty 60

## 2015-04-27 MED ORDER — CEFAZOLIN SODIUM-DEXTROSE 2-3 GM-% IV SOLR: 2.0000 g | INTRAVENOUS | Status: DC

## 2015-04-27 MED ORDER — SODIUM CHLORIDE 0.9 % IR SOLN
Status: AC
  Filled 2015-04-27: qty 2

## 2015-04-27 MED ORDER — CEFAZOLIN SODIUM-DEXTROSE 2-3 GM-% IV SOLR
INTRAVENOUS | Status: AC
  Filled 2015-04-27: qty 50

## 2015-04-27 MED ORDER — MIDAZOLAM HCL 5 MG/5ML IJ SOLN
INTRAMUSCULAR | Status: AC
  Filled 2015-04-27: qty 25

## 2015-04-27 MED ORDER — CEFAZOLIN SODIUM-DEXTROSE 2-3 GM-% IV SOLR
INTRAVENOUS | Status: DC | PRN
  Administered 2015-04-27: 2 g via INTRAVENOUS

## 2015-04-27 MED ORDER — FENTANYL CITRATE (PF) 100 MCG/2ML IJ SOLN
INTRAMUSCULAR | Status: AC
  Filled 2015-04-27: qty 4

## 2015-04-27 MED ORDER — SODIUM CHLORIDE 0.9 % IR SOLN: 80.0000 mg | Status: DC

## 2015-04-27 MED ORDER — CHLORHEXIDINE GLUCONATE 4 % EX LIQD: 60.0000 mL | Freq: Once | CUTANEOUS | Status: DC

## 2015-04-27 MED ORDER — CEFAZOLIN SODIUM 1-5 GM-% IV SOLN: 1.0000 g | Freq: Four times a day (QID) | INTRAVENOUS | Status: DC

## 2015-04-27 MED ORDER — MUPIROCIN 2 % EX OINT
TOPICAL_OINTMENT | CUTANEOUS | Status: AC
  Filled 2015-04-27: qty 22

## 2015-04-27 MED ORDER — HEPARIN (PORCINE) IN NACL 2-0.9 UNIT/ML-% IJ SOLN
INTRAMUSCULAR | Status: AC
  Filled 2015-04-27: qty 500

## 2015-04-27 MED ORDER — ACETAMINOPHEN 325 MG PO TABS: 325.0000 mg | ORAL_TABLET | ORAL | Status: DC | PRN

## 2015-04-27 MED ORDER — SODIUM CHLORIDE 0.9 % IV SOLN
INTRAVENOUS | Status: DC
  Administered 2015-04-27: 14:00:00 via INTRAVENOUS

## 2015-04-27 SURGICAL SUPPLY — 5 items
CABLE SURGICAL S-101-97-12 (CABLE) ×2 IMPLANT
PACEMAKER ESSENTIO SR L100 (Pacemaker) ×1 IMPLANT
PAD DEFIB LIFELINK (PAD) ×2 IMPLANT
PPM ESSENTIO SR L100 (Pacemaker) ×2 IMPLANT
TRAY PACEMAKER INSERTION (PACKS) ×2 IMPLANT

## 2015-04-27 NOTE — H&P (View-Only) (Signed)
HPI Brent Dunn returns today for ongoing evaluation of his PPM. He is a pleasant 79 yo man with a h/o chronic atrial fibrillaion, symptomatic bradycardia and is s/p PPM insertion. He moved from [place] to  approximately a year ago to be closer to family. He has a remote stroke and a right HP. He has decreased vision from he stroke. He has chronic dyspnea which is class 2. No syncope or chest pain. He has reached ERI on his PPM. No Known Allergies   Current Outpatient Prescriptions  Medication Sig Dispense Refill  . acetaminophen (TYLENOL) 650 MG CR tablet Take 650 mg by mouth every 8 (eight) hours as needed for pain.    Marland Kitchen amLODipine (NORVASC) 2.5 MG tablet Take 2.5 mg by mouth daily.    Marland Kitchen atorvastatin (LIPITOR) 40 MG tablet Take 40 mg by mouth daily.    Marland Kitchen BAYER CONTOUR TEST test strip   1  . donepezil (ARICEPT) 10 MG tablet TAKE 1 TABLET AT BEDTIME 90 tablet 0  . escitalopram (LEXAPRO) 10 MG tablet Take 10 mg by mouth daily.    . folic acid (FOLVITE) 1 MG tablet Take 1 mg by mouth daily.    Marland Kitchen levETIRAcetam (KEPPRA) 500 MG tablet Take 1 tablet (500 mg total) by mouth every 12 (twelve) hours. 180 tablet 1  . lisinopril (PRINIVIL,ZESTRIL) 20 MG tablet Take 20 mg by mouth daily.    . metformin (FORTAMET) 500 MG (OSM) 24 hr tablet Take 500 mg by mouth daily.     . metoprolol (LOPRESSOR) 50 MG tablet Take 50 mg by mouth 2 (two) times daily.     Marland Kitchen MYRBETRIQ 50 MG TB24 tablet Take 50 mg by mouth daily.     . TOVIAZ 8 MG TB24 tablet Take 8 mg by mouth daily.     Marland Kitchen warfarin (COUMADIN) 6 MG tablet Take as directed by the coumadin clinic     No current facility-administered medications for this visit.     Past Medical History  Diagnosis Date  . Stroke ([place])   . Atrial fibrillation ([place])   . High blood pressure   . Depression   . Diabetes mellitus type 2, controlled ([place])   . High cholesterol   . History of stroke   . Dementia     ROS:   All systems reviewed and negative  except as noted in the HPI.   Past Surgical History  Procedure Laterality Date  . Appendectomy    . Pacemaker insertion       Family History  Problem Relation Age of Onset  . Stroke Father   . High blood pressure Father   . Stroke Mother   . Diabetes Sister   . Cancer Brother     colon     Social History   Social History  . Marital Status: Single    Spouse Name: N/A  . Number of Children: 2  . Years of Education: 12   Occupational History  .      Retired   Social History Main Topics  . Smoking status: Former Smoker    Types: Cigarettes  . Smokeless tobacco: Never Used     Comment: 1980's  . Alcohol Use: No  . Drug Use: No  . Sexual Activity: Not on file   Other Topics Concern  . Not on file   Social History Narrative   Patient  Lives at home with his brother  Brent Dunn) Patient is retired. Patient has  a 12th grade education. Right handed.   Caffeine - None      Sister Brent Dunn - [phone]   Brent Dunn Brother- [phone]     BP 118/84 mmHg  Pulse 82  Ht 5\' 8"  (1.727 m)  Wt 169 lb 12.8 oz (77.021 kg)  BMI 25.82 kg/m2  SpO2 96%  Physical Exam:  Well appearing 79 yo man, NAD HEENT: Unremarkable Neck:  6 cm JVD, no thyromegally Back:  No CVA tenderness Lungs:  Clear with no wheezes HEART:  IRegular rate rhythm, no murmurs, no rubs, no clicks Abd:  soft, positive bowel sounds, no organomegally, no rebound, no guarding Ext:  2 plus pulses, no edema, no cyanosis, no clubbing Skin:  No rashes no nodules Neuro:  CN II through XII intact, motor grossly intact  DEVICE  Normal device function.  See PaceArt for details. Device at [facility] for 10 weeks.  Assess/Plan:

## 2015-04-27 NOTE — Discharge Instructions (Signed)

## 2015-04-27 NOTE — Interval H&P Note (Signed)
History and Physical Interval Note:  04/27/2015 2:57 PM  Brent Dunn  has presented today for surgery, with the diagnosis of eri  The various methods of treatment have been discussed with the patient and family. After consideration of risks, benefits and other options for treatment, the patient has consented to  Procedure(s): PPM Generator Changeout (N/A) as a surgical intervention .  The patient's history has been reviewed, patient examined, no change in status, stable for surgery.  I have reviewed the patient's chart and labs.  Questions were answered to the patient's satisfaction.     Cristopher Peru

## 2015-04-28 ENCOUNTER — Encounter (HOSPITAL_COMMUNITY): Payer: Self-pay | Admitting: Internal Medicine

## 2015-04-28 MED FILL — Lidocaine HCl Local Preservative Free (PF) Inj 1%: INTRAMUSCULAR | Qty: 30 | Status: AC

## 2015-04-28 MED FILL — Heparin Sodium (Porcine) 2 Unit/ML in Sodium Chloride 0.9%: INTRAMUSCULAR | Qty: 500 | Status: AC

## 2015-04-28 MED FILL — Gentamicin Sulfate Inj 40 MG/ML: INTRAMUSCULAR | Qty: 80 | Status: AC

## 2015-05-10 ENCOUNTER — Ambulatory Visit (INDEPENDENT_AMBULATORY_CARE_PROVIDER_SITE_OTHER): Payer: Medicare Other | Admitting: *Deleted

## 2015-05-10 DIAGNOSIS — I482 Chronic atrial fibrillation, unspecified: Secondary | ICD-10-CM

## 2015-05-10 DIAGNOSIS — Z950 Presence of cardiac pacemaker: Secondary | ICD-10-CM

## 2015-05-10 LAB — CUP PACEART INCLINIC DEVICE CHECK
Implantable Lead Implant Date: 20060302
Lead Channel Sensing Intrinsic Amplitude: 8.6 mV
Lead Channel Setting Pacing Amplitude: 3.8 V
Lead Channel Setting Pacing Pulse Width: 0.4 ms
MDC IDC LEAD LOCATION: 753860
MDC IDC LEAD MODEL: 4035
MDC IDC LEAD SERIAL: 209135
MDC IDC MSMT LEADCHNL RV IMPEDANCE VALUE: 877 Ohm
MDC IDC MSMT LEADCHNL RV PACING THRESHOLD AMPLITUDE: 1.3 V
MDC IDC MSMT LEADCHNL RV PACING THRESHOLD PULSEWIDTH: 0.4 ms
MDC IDC SESS DTM: 20161121050000
MDC IDC SET LEADCHNL RV SENSING SENSITIVITY: 2.5 mV
MDC IDC STAT BRADY RV PERCENT PACED: 20 %
Pulse Gen Serial Number: 708065

## 2015-05-10 NOTE — Progress Notes (Signed)
Wound check appointment. Band-aids removed. Wound without redness or edema. Incision edges approximated, wound well healed. Normal device function. Threshold, sensing, and impedances consistent with implant measurements. Histogram distribution appropriate for patient and level of activity. 2 high ventricular rates noted- EGMs appear to be AF RVR. Patient educated about wound care. ROV with GT 07/29/15.

## 2015-05-19 DIAGNOSIS — R2689 Other abnormalities of gait and mobility: Secondary | ICD-10-CM | POA: Diagnosis not present

## 2015-05-19 DIAGNOSIS — M25511 Pain in right shoulder: Secondary | ICD-10-CM | POA: Diagnosis not present

## 2015-05-19 DIAGNOSIS — R29898 Other symptoms and signs involving the musculoskeletal system: Secondary | ICD-10-CM | POA: Diagnosis not present

## 2015-05-19 DIAGNOSIS — M25611 Stiffness of right shoulder, not elsewhere classified: Secondary | ICD-10-CM | POA: Diagnosis not present

## 2015-05-19 DIAGNOSIS — I69398 Other sequelae of cerebral infarction: Secondary | ICD-10-CM | POA: Diagnosis not present

## 2015-05-27 DIAGNOSIS — Z7901 Long term (current) use of anticoagulants: Secondary | ICD-10-CM | POA: Diagnosis not present

## 2015-05-27 DIAGNOSIS — I4891 Unspecified atrial fibrillation: Secondary | ICD-10-CM | POA: Diagnosis not present

## 2015-06-01 ENCOUNTER — Encounter: Payer: Self-pay | Admitting: Internal Medicine

## 2015-06-02 DIAGNOSIS — M25511 Pain in right shoulder: Secondary | ICD-10-CM | POA: Diagnosis not present

## 2015-06-02 DIAGNOSIS — R2689 Other abnormalities of gait and mobility: Secondary | ICD-10-CM | POA: Diagnosis not present

## 2015-06-02 DIAGNOSIS — R29898 Other symptoms and signs involving the musculoskeletal system: Secondary | ICD-10-CM | POA: Diagnosis not present

## 2015-06-02 DIAGNOSIS — I69398 Other sequelae of cerebral infarction: Secondary | ICD-10-CM | POA: Diagnosis not present

## 2015-06-18 DIAGNOSIS — F0390 Unspecified dementia without behavioral disturbance: Secondary | ICD-10-CM | POA: Diagnosis not present

## 2015-06-18 DIAGNOSIS — I509 Heart failure, unspecified: Secondary | ICD-10-CM | POA: Diagnosis not present

## 2015-06-18 DIAGNOSIS — Z950 Presence of cardiac pacemaker: Secondary | ICD-10-CM | POA: Diagnosis not present

## 2015-06-18 DIAGNOSIS — I482 Chronic atrial fibrillation: Secondary | ICD-10-CM | POA: Diagnosis not present

## 2015-06-18 DIAGNOSIS — E119 Type 2 diabetes mellitus without complications: Secondary | ICD-10-CM | POA: Diagnosis not present

## 2015-06-18 DIAGNOSIS — Z7982 Long term (current) use of aspirin: Secondary | ICD-10-CM | POA: Diagnosis not present

## 2015-06-18 DIAGNOSIS — E7800 Pure hypercholesterolemia, unspecified: Secondary | ICD-10-CM | POA: Diagnosis not present

## 2015-06-18 DIAGNOSIS — E785 Hyperlipidemia, unspecified: Secondary | ICD-10-CM | POA: Insufficient documentation

## 2015-06-18 DIAGNOSIS — Z79899 Other long term (current) drug therapy: Secondary | ICD-10-CM | POA: Diagnosis not present

## 2015-06-18 DIAGNOSIS — G319 Degenerative disease of nervous system, unspecified: Secondary | ICD-10-CM | POA: Diagnosis not present

## 2015-06-18 DIAGNOSIS — R918 Other nonspecific abnormal finding of lung field: Secondary | ICD-10-CM | POA: Diagnosis not present

## 2015-06-18 DIAGNOSIS — R29810 Facial weakness: Secondary | ICD-10-CM | POA: Diagnosis not present

## 2015-06-18 DIAGNOSIS — R5383 Other fatigue: Secondary | ICD-10-CM | POA: Diagnosis not present

## 2015-06-18 DIAGNOSIS — Z8673 Personal history of transient ischemic attack (TIA), and cerebral infarction without residual deficits: Secondary | ICD-10-CM | POA: Diagnosis not present

## 2015-06-18 DIAGNOSIS — G300 Alzheimer's disease with early onset: Secondary | ICD-10-CM | POA: Diagnosis not present

## 2015-06-18 DIAGNOSIS — I10 Essential (primary) hypertension: Secondary | ICD-10-CM | POA: Diagnosis not present

## 2015-06-18 DIAGNOSIS — G459 Transient cerebral ischemic attack, unspecified: Secondary | ICD-10-CM | POA: Diagnosis not present

## 2015-06-18 DIAGNOSIS — Z7984 Long term (current) use of oral hypoglycemic drugs: Secondary | ICD-10-CM | POA: Diagnosis not present

## 2015-06-18 DIAGNOSIS — R531 Weakness: Secondary | ICD-10-CM | POA: Diagnosis not present

## 2015-06-18 DIAGNOSIS — D649 Anemia, unspecified: Secondary | ICD-10-CM | POA: Diagnosis not present

## 2015-06-18 DIAGNOSIS — F0280 Dementia in other diseases classified elsewhere without behavioral disturbance: Secondary | ICD-10-CM | POA: Diagnosis not present

## 2015-06-19 DIAGNOSIS — I482 Chronic atrial fibrillation: Secondary | ICD-10-CM | POA: Diagnosis not present

## 2015-06-19 DIAGNOSIS — I517 Cardiomegaly: Secondary | ICD-10-CM | POA: Diagnosis not present

## 2015-06-19 DIAGNOSIS — Z950 Presence of cardiac pacemaker: Secondary | ICD-10-CM | POA: Diagnosis not present

## 2015-06-19 DIAGNOSIS — I088 Other rheumatic multiple valve diseases: Secondary | ICD-10-CM | POA: Diagnosis not present

## 2015-06-19 DIAGNOSIS — I313 Pericardial effusion (noninflammatory): Secondary | ICD-10-CM | POA: Diagnosis not present

## 2015-06-19 DIAGNOSIS — G459 Transient cerebral ischemic attack, unspecified: Secondary | ICD-10-CM | POA: Diagnosis not present

## 2015-06-19 DIAGNOSIS — I10 Essential (primary) hypertension: Secondary | ICD-10-CM | POA: Diagnosis not present

## 2015-06-19 DIAGNOSIS — G300 Alzheimer's disease with early onset: Secondary | ICD-10-CM | POA: Diagnosis not present

## 2015-06-19 DIAGNOSIS — F0280 Dementia in other diseases classified elsewhere without behavioral disturbance: Secondary | ICD-10-CM | POA: Diagnosis not present

## 2015-06-19 DIAGNOSIS — I519 Heart disease, unspecified: Secondary | ICD-10-CM | POA: Diagnosis not present

## 2015-06-29 ENCOUNTER — Other Ambulatory Visit: Payer: Self-pay | Admitting: Neurology

## 2015-06-29 DIAGNOSIS — Z7901 Long term (current) use of anticoagulants: Secondary | ICD-10-CM | POA: Diagnosis not present

## 2015-06-29 DIAGNOSIS — I4891 Unspecified atrial fibrillation: Secondary | ICD-10-CM | POA: Diagnosis not present

## 2015-07-12 ENCOUNTER — Ambulatory Visit (INDEPENDENT_AMBULATORY_CARE_PROVIDER_SITE_OTHER): Payer: Medicare Other | Admitting: Nurse Practitioner

## 2015-07-12 ENCOUNTER — Encounter: Payer: Self-pay | Admitting: Nurse Practitioner

## 2015-07-12 VITALS — BP 133/84 | HR 65 | Ht 68.0 in | Wt 168.6 lb

## 2015-07-12 DIAGNOSIS — Z8673 Personal history of transient ischemic attack (TIA), and cerebral infarction without residual deficits: Secondary | ICD-10-CM | POA: Diagnosis not present

## 2015-07-12 DIAGNOSIS — R569 Unspecified convulsions: Secondary | ICD-10-CM

## 2015-07-12 DIAGNOSIS — F0390 Unspecified dementia without behavioral disturbance: Secondary | ICD-10-CM | POA: Diagnosis not present

## 2015-07-12 DIAGNOSIS — I10 Essential (primary) hypertension: Secondary | ICD-10-CM

## 2015-07-12 DIAGNOSIS — I482 Chronic atrial fibrillation, unspecified: Secondary | ICD-10-CM

## 2015-07-12 MED ORDER — DONEPEZIL HCL 10 MG PO TABS: 10.0000 mg | ORAL_TABLET | Freq: Every day | ORAL | Status: DC

## 2015-07-12 MED ORDER — LEVETIRACETAM 500 MG PO TABS: 500.0000 mg | ORAL_TABLET | Freq: Two times a day (BID) | ORAL | Status: DC

## 2015-07-12 NOTE — Progress Notes (Signed)
[facility]  PATIENT: Brent Dunn DOB: [birth year]   REASON FOR VISIT: Follow-up for history of stroke, memory loss and seizure disorder HISTORY FROM: Patient and brother    HISTORY OF PRESENT ILLNESS: HISTORYMr. Dunn is a 80 years old right-handed African American male, accompanied by his sister, referred by primary care doctor Brent Dunn for evaluation of stroke, seizure, He had a past medical history of diabetes, hypertension, depression, hyperlipidemia, atrial fibrillation, on chronic Coumadin treatment, also has pacemaker,he is a poor historian, his sister provides Brent majority of Brent history, he barely volunteer for information He suffered a stroke in March 2014, was treated at [facility], per referring notes, stroke involving left occipital, cerebellar region, he now moves down living with his brother, his 2 sisters checks on him every day,  Prior of stroke he lives alone at [facility], but he cannot be left alone, confused, not following commands, gait difficulty, difficulty seeing his right side, difficulty reading, He had a seizure following his stroke, taking Keppra 500 twice a day, there was no recurrent seizures since April 2014 after he moved to [facility] He graduated from [facility] school, worked at a [employer], fashion/clothing job,  UPDATE Jul 10 2013: He came in with his sister today, he is living with his brother now, he ambulate without assistance, he can dress and feed himself, no recurrent seizure, he continues to have significant memory loss. He talked more than previous visit.  UPDATE Nov 9th 2015: He is with his brother [facility] Dunn today, He is getting more confused, especially in morning time, sometimes wet himself, He has a Actuary daily. He is quiet, no seiuzre, he sits quietly most of time, he can dress, bath feed himself, talk occasionally. He is eating well, sleeping well.  He lives with his brother Brent Dunn, sometimes visiting with his sisters.  CT head in  August 2015, No evidence of acute intracranial abnormality. Encephalomalacic changes related to prior infarcts. Atrophy with small vessel ischemic changes and intracranial atherosclerosis. UPDATE Jan 11th 2016:He was started on Aricept since August 2015, he has no seizure, continue have slow progression memory trouble. Update 01/07/2015 Brent Dunn, 80 year old male returns for follow-up with his brother. He has history of memory loss which has been progressive he also has a history of stroke involving Brent left occipital cerebellum region and poststroke seizures. He is currently on Keppra without any seizure activity since last seen. Memory is stable according to Brent brother he is on Coumadin for secondary stroke prevention and Aricept and Namenda for his memory loss without side effects. He returns for follow-up UPDATE 07/12/2015 [name], 80 year old male returns for follow-up. He has a history stroke involving Brent left occipital cerebellum region and seizure disorder. He also has memory loss which has been progressive. He was on Namenda and Aricept when last seen however his Lenox Ponds has been stopped by his primary care. His Coumadin is checked every 3-4 weeks and has not had a recent dose change. He has not had recurrent stroke or TIA symptoms. He has not had further seizures. He is currently well-controlled on Keppra. He has in home help  every day while Brent brother works. Appetite is good he is sleeping well    REVIEW OF SYSTEMS: Full 14 system review of systems performed and notable only for those listed, all others are neg:  Constitutional: Fatigue Cardiovascular: neg Ear/Nose/Throat: neg  Skin: neg Eyes: neg Respiratory: neg Gastroitestinal: neg  Hematology/Lymphatic: neg  Endocrine: neg Musculoskeletal:neg Allergy/Immunology: neg Neurological: Memory loss, seizure  disorder Psychiatric: Depression Sleep : neg   ALLERGIES: No Known Allergies  HOME MEDICATIONS: Outpatient  Prescriptions Prior to Visit  Medication Sig Dispense Refill  . acetaminophen (TYLENOL) 650 MG CR tablet Take 650 mg by mouth every 8 (eight) hours as needed for pain.    Marland Kitchen amLODipine (NORVASC) 2.5 MG tablet Take 2.5 mg by mouth daily.    Marland Kitchen atorvastatin (LIPITOR) 40 MG tablet Take 40 mg by mouth daily.    Marland Kitchen BAYER CONTOUR TEST test strip   1  . donepezil (ARICEPT) 10 MG tablet TAKE 1 TABLET AT BEDTIME 90 tablet 0  . escitalopram (LEXAPRO) 10 MG tablet Take 10 mg by mouth daily.    . folic acid (FOLVITE) 1 MG tablet Take 1 mg by mouth daily.    Marland Kitchen levETIRAcetam (KEPPRA) 500 MG tablet TAKE 1 TABLET BY MOUTH EVERY 12 HOURS 180 tablet 0  . lisinopril (PRINIVIL,ZESTRIL) 20 MG tablet Take 20 mg by mouth daily.    . metformin (FORTAMET) 500 MG (OSM) 24 hr tablet Take 500 mg by mouth daily.     . metoprolol (LOPRESSOR) 50 MG tablet Take 50 mg by mouth 2 (two) times daily.     Marland Kitchen warfarin (COUMADIN) 6 MG tablet Take 6 mg by mouth daily. Take as directed by [facility]    . levETIRAcetam (KEPPRA) 500 MG tablet Take 1 tablet (500 mg total) by mouth every 12 (twelve) hours. 180 tablet 1   No facility-administered medications prior to visit.    PAST MEDICAL HISTORY: Past Medical History  Diagnosis Date  . Stroke ([place])   . Atrial fibrillation ([place])   . High blood pressure   . Depression   . Diabetes mellitus type 2, controlled ([place])   . High cholesterol   . History of stroke   . Dementia     PAST SURGICAL HISTORY: Past Surgical History  Procedure Laterality Date  . Appendectomy    . Pacemaker insertion    . Ep implantable device N/A 04/27/2015    Procedure: PPM Generator Changeout;  Surgeon: Evans Lance, MD;  Location: [facility];  Service: Cardiovascular;  Laterality: N/A;    FAMILY HISTORY: Family History  Problem Relation Age of Onset  . Stroke Father   . High blood pressure Father   . Stroke Mother   . Diabetes Sister   . Cancer Brother     colon    SOCIAL  HISTORY: Social History   Social History  . Marital Status: Single    Spouse Name: N/A  . Number of Children: 2  . Years of Education: 12   Occupational History  .      Retired   Social History Main Topics  . Smoking status: Former Smoker    Types: Cigarettes  . Smokeless tobacco: Never Used     Comment: 1980's  . Alcohol Use: No  . Drug Use: No  . Sexual Activity: Not on file   Other Topics Concern  . Not on file   Social History Narrative   Patient  Lives at home with his brother  [name]) Patient is retired. Patient has a 12th grade education. Right handed.   Caffeine - None      Sister [name] - [id number]   [name] Brother- [phone]     PHYSICAL EXAM  Filed Vitals:   07/12/15 1421  BP: 133/84  Pulse: 65  Height: 5\' 8"  (1.727 m)  Weight: 168 lb 9.6 oz (76.476 kg)  Body mass index is 25.64 kg/(m^2). Generalized: In no acute distress Neck: Supple, no carotid bruits  Cardiac: irregular rate rhythm Musculoskeletal: No deformity  Neurological examination Mentation: flat expression, slow speech, cooperative, Right hemineglect, He has difficulty with naming, writing, reading, Mini-Mental Status Examination not done as he does not respond to questions or says I dont know. Last was  9/30,,  Cranial nerve II-XII: Pupils were equal round reactive to light extraocular movements were full, Right visual field cut. facial sensation and strength were normal. hearing was intact to finger rubbing bilaterally. Uvula tongue midline. head turning and shoulder shrug and were normal and symmetric.Tongue protrusion into cheek strength was normal. Motor: normal tone, bulk and strength. Right hemi-neglect  Sensory: intact to light touch Coordination: Normal finger to nose, heel-to-shin bilaterally there was no truncal ataxia Gait: cautious, moderate stride, decreased right arm swing, no assistive device Romberg signs: Negative Deep tendon reflexes: Brachioradialis 2/2,  biceps 2/2, triceps 2/2, patellar 2/2, Achilles 2/2, plantar responses were flexor bilaterally.  DIAGNOSTIC DATA (LABS, IMAGING, TESTING) - I reviewed patient records, labs, notes, testing and imaging myself where available.  Lab Results  Component Value Date   WBC 6.1 04/26/2015   HGB 12.4* 04/26/2015   HCT 38.0* 04/26/2015   MCV 88.0 04/26/2015   PLT 226 04/26/2015      Component Value Date/Time   NA 143 04/26/2015 1436   K 4.5 04/26/2015 1436   CL 105 04/26/2015 1436   CO2 26 04/26/2015 1436   GLUCOSE 69 04/26/2015 1436   BUN 13 04/26/2015 1436   CREATININE 1.06 04/26/2015 1436   CREATININE 1.13 03/03/2015 1512   CALCIUM 8.8 04/26/2015 1436   PROT 6.6 07/23/2014 1856   ALBUMIN 3.1* 07/23/2014 1856   AST 24 07/23/2014 1856   ALT 17 07/23/2014 1856   ALKPHOS 69 07/23/2014 1856   BILITOT 0.5 07/23/2014 1856   GFRNONAA 32* 07/23/2014 1856   GFRAA 35* 07/23/2014 1856    ASSESSMENT AND PLAN 80 y.o. year old male has a past medical history of Stroke; Atrial fibrillation; High blood pressure; Depression; Diabetes mellitus type 2, controlled; High cholesterol; History of stroke; and Dementia.  Continue Keppra at current dose will refill Continue Aricept at current dose will refill Namenda stopped by PCP according to Brent brother Continue Coumadin for secondary stroke prevention Follow-up in 6 months, next with Dr Krista Blue Dennie [facility], [facility], [facility], [facility] [address] [place], [place] [address] [phone]

## 2015-07-12 NOTE — Progress Notes (Signed)
I have reviewed and agreed above plan. 

## 2015-07-12 NOTE — Patient Instructions (Signed)
Continue Keppra at current dose will refill Namenda was discontinued by Dr. Moreen Fowler.  Continue Aricept at current dosewill refill Continue Coumadin for secondary stroke prevention Follow-up in 6 months next with Dr. Krista Blue

## 2015-07-16 ENCOUNTER — Telehealth: Payer: Self-pay

## 2015-07-16 NOTE — Telephone Encounter (Signed)
LFt vm for Amy at  [facility]. Pt needs neuro clearance because of past stroke. Pt has been seen by Carolyn(NP) and Dr. Krista Blue at [facility] for stroke and dementia follow up for the last three years. Dr.Sethi has not seen patient in outpatient. Message was left for form to be sent to Dr.Yan. Rn will call on MOnday.

## 2015-07-19 NOTE — Telephone Encounter (Signed)
Rn call Amy at [facility] for [place]. Rn stated Dr. Krista Blue has been treating patients stroke and dementia since 2014. Amy stated a form was fax to Dr.Yan on Friday for hemorrhoid branding clearance for patient at [facility].

## 2015-07-29 ENCOUNTER — Encounter: Payer: Medicare Other | Admitting: Internal Medicine

## 2015-07-30 ENCOUNTER — Encounter: Payer: Self-pay | Admitting: Internal Medicine

## 2015-07-31 ENCOUNTER — Other Ambulatory Visit: Payer: Self-pay | Admitting: Neurology

## 2015-12-23 ENCOUNTER — Encounter: Payer: Self-pay | Admitting: Neurology

## 2015-12-27 DIAGNOSIS — H47611 Cortical blindness, right side of brain: Secondary | ICD-10-CM | POA: Diagnosis not present

## 2015-12-27 DIAGNOSIS — H04123 Dry eye syndrome of bilateral lacrimal glands: Secondary | ICD-10-CM | POA: Diagnosis not present

## 2015-12-27 DIAGNOSIS — H2511 Age-related nuclear cataract, right eye: Secondary | ICD-10-CM | POA: Diagnosis not present

## 2015-12-27 DIAGNOSIS — Z961 Presence of intraocular lens: Secondary | ICD-10-CM | POA: Diagnosis not present

## 2016-01-10 ENCOUNTER — Ambulatory Visit: Payer: Medicare Other | Admitting: Neurology

## 2016-02-22 ENCOUNTER — Ambulatory Visit: Payer: Medicare Other | Admitting: Neurology

## 2016-03-14 ENCOUNTER — Ambulatory Visit (INDEPENDENT_AMBULATORY_CARE_PROVIDER_SITE_OTHER): Payer: Medicare Other | Admitting: Neurology

## 2016-03-14 ENCOUNTER — Encounter: Payer: Self-pay | Admitting: Neurology

## 2016-03-14 VITALS — BP 119/85 | HR 60 | Ht 68.0 in | Wt 166.0 lb

## 2016-03-14 DIAGNOSIS — Z950 Presence of cardiac pacemaker: Secondary | ICD-10-CM

## 2016-03-14 DIAGNOSIS — Z8673 Personal history of transient ischemic attack (TIA), and cerebral infarction without residual deficits: Secondary | ICD-10-CM

## 2016-03-14 DIAGNOSIS — F0390 Unspecified dementia without behavioral disturbance: Secondary | ICD-10-CM | POA: Diagnosis not present

## 2016-03-14 DIAGNOSIS — I6389 Other cerebral infarction: Secondary | ICD-10-CM

## 2016-03-14 DIAGNOSIS — I638 Other cerebral infarction: Secondary | ICD-10-CM

## 2016-03-14 DIAGNOSIS — R569 Unspecified convulsions: Secondary | ICD-10-CM | POA: Diagnosis not present

## 2016-03-14 MED ORDER — LEVETIRACETAM 500 MG PO TABS: 500.0000 mg | ORAL_TABLET | Freq: Two times a day (BID) | ORAL | 4 refills | Status: DC

## 2016-03-14 MED ORDER — SOLIFENACIN SUCCINATE 5 MG PO TABS: 5.0000 mg | ORAL_TABLET | Freq: Every day | ORAL | 11 refills | Status: DC

## 2016-03-14 NOTE — Progress Notes (Signed)
[facility]  PATIENT: Brent Dunn DOB: 1936/05/19  HISTORY OF PRESENT ILLNESS: Mr. [name] is a 80 years old right-handed African American male, follow for stroke and seizure  He had a past medical history of diabetes, hypertension, depression, hyperlipidemia, atrial fibrillation, on chronic Coumadin treatment, also has pacemaker,he is a poor historian, his sister provides the majority of the history, he barely volunteer for information  He suffered a stroke in March 2014, was treated at [place], per referring notes, stroke involving left occipital, cerebellar region, he now moves down living with his brother, his 2 sisters checks on him every day,  Prior of stroke he lives alone at [place], but he cannot be left alone, confused, not following commands, gait difficulty, difficulty seeing his right side, difficulty reading, He had a seizure following his stroke, taking Keppra 500 twice a day, there was no recurrent seizures since April 2014 after he moved to [place]  He graduated from [employer] school, worked at a Designer, fashion/clothing job,  UPDATE Jul 10 2013: He came in with his sister today, he is living with his brother now, he ambulate without assistance, he can dress and feed himself, no recurrent seizure, he continues to have significant memory loss. He talked more than previous visit.  UPDATE Nov 9th 2015: He is with [name] today, He is getting more confused, especially in morning time, sometimes wet himself, He has a Actuary daily. He is quiet, no seiuzre, he sits quietly most of time, he can dress, bath feed himself, talk occasionally. He is eating well, sleeping well.  He lives with [name], sometimes visiting with his sisters.  CT head in August 2015, No evidence of acute intracranial abnormality. Encephalomalacic changes related to prior infarcts. Atrophy with small vessel ischemic changes and intracranial atherosclerosis. UPDATE Jan 11th  2016:He was started on Aricept since August 2015, he has no seizure, continue have slow progression memory trouble. Update 01/07/2015 [name], 80 year old male returns for follow-up with his brother. He has history of memory loss which has been progressive he also has a history of stroke involving the left occipital cerebellum region and poststroke seizures. He is currently on Keppra without any seizure activity since last seen. Memory is stable according to the brother he is on Coumadin for secondary stroke prevention and Aricept and Namenda for his memory loss without side effects. He returns for follow-up UPDATE 07/12/2015 [name], 80 year old male returns for follow-up. He has a history stroke involving the left occipital cerebellum region and seizure disorder. He also has memory loss which has been progressive. He was on Namenda and Aricept when last seen however his Lenox Ponds has been stopped by his primary care. His Coumadin is checked every 3-4 weeks and has not had a recent dose change. He has not had recurrent stroke or TIA symptoms. He has not had further seizures. He is currently well-controlled on Keppra. He has in home help  every day while the brother works. Appetite is good he is sleeping well  UPDATE Sep 26th 2017: He is taking Keppra 500 mg twice a day, had no recurrent seizure, lives with his brother [name], he is with some body all the times, he has good appeitied, sleeps well, quiet, barely carry a conversation   Aricept did not help, he is not taking it anymore, mild unsteady gait, frequent urinary incontinence, rarely bowel incontinence   REVIEW OF SYSTEMS: Full 14 system review of systems performed and notable only for those listed, all  others are neg:     ALLERGIES: No Known Allergies  HOME MEDICATIONS: Outpatient Medications Prior to Visit  Medication Sig Dispense Refill  . amLODipine (NORVASC) 2.5 MG tablet Take 2.5 mg by mouth daily.    Marland Kitchen aspirin 81 MG tablet Take 81  mg by mouth daily.    Marland Kitchen atorvastatin (LIPITOR) 40 MG tablet Take 40 mg by mouth daily.    Marland Kitchen BAYER CONTOUR TEST test strip   1  . escitalopram (LEXAPRO) 10 MG tablet Take 10 mg by mouth daily.    . folic acid (FOLVITE) 1 MG tablet Take 1 mg by mouth daily.    Marland Kitchen levETIRAcetam (KEPPRA) 500 MG tablet Take 1 tablet (500 mg total) by mouth every 12 (twelve) hours. 180 tablet 1  . lisinopril (PRINIVIL,ZESTRIL) 20 MG tablet Take 20 mg by mouth daily.    . metformin (FORTAMET) 500 MG (OSM) 24 hr tablet Take 500 mg by mouth daily.     . metoprolol (LOPRESSOR) 50 MG tablet Take 50 mg by mouth 2 (two) times daily.     Marland Kitchen warfarin (COUMADIN) 6 MG tablet Take 6 mg by mouth daily. Take as directed by the coumadin clinic    . acetaminophen (TYLENOL) 650 MG CR tablet Take 650 mg by mouth every 8 (eight) hours as needed for pain.    Marland Kitchen donepezil (ARICEPT) 10 MG tablet Take 1 tablet (10 mg total) by mouth at bedtime. 90 tablet 1   No facility-administered medications prior to visit.     PAST MEDICAL HISTORY: Past Medical History:  Diagnosis Date  . Atrial fibrillation ([place])   . Dementia   . Depression   . Diabetes mellitus type 2, controlled ([place])   . High blood pressure   . High cholesterol   . History of stroke   . Stroke [facility])     PAST SURGICAL HISTORY: Past Surgical History:  Procedure Laterality Date  . APPENDECTOMY    . EP IMPLANTABLE DEVICE N/A 04/27/2015   Procedure: PPM Generator Changeout;  Surgeon: Evans Lance, MD;  Location: [facility];  Service: Cardiovascular;  Laterality: N/A;  . PACEMAKER INSERTION      FAMILY HISTORY: Family History  Problem Relation Age of Onset  . Stroke Father   . High blood pressure Father   . Stroke Mother   . Diabetes Sister   . Cancer Brother     colon    SOCIAL HISTORY: Social History   Social History  . Marital status: Single    Spouse name: N/A  . Number of children: 2  . Years of education: 12   Occupational History  .       Retired   Social History Main Topics  . Smoking status: Former Smoker    Types: Cigarettes  . Smokeless tobacco: Never Used     Comment: 1980's  . Alcohol use No  . Drug use: No  . Sexual activity: Not on file   Other Topics Concern  . Not on file   Social History Narrative   Patient  Lives at home with his brother  [name]) Patient is retired. Patient has a 12th grade education. Right handed.   Caffeine - None      Sister [name] - [id number]   [name] Brother- [phone]     PHYSICAL EXAM  Vitals:   03/14/16 1451  BP: 119/85  Pulse: 60  Weight: 166 lb (75.3 kg)  Height: 5\' 8"  (1.727 m)   Body mass  index is 25.24 kg/m. Generalized: In no acute distress Neck: Supple, no carotid bruits  Cardiac: irregular rate rhythm Musculoskeletal: No deformity  Neurological examination MENTAL STATUS: Speech: Quiet, does not spontaneous for information Cognition: Mini-Mental Status Examination 7/30, animal naming 2     Orientation: He is not oriented to time place years season      recent and remote memory: He missed 3/3 recalls     Attention span and concentration: He could not spell world backwards     Normal Language, naming, repeating,spontaneous speech     Fund of knowledge   ,  Cranial nerve II-XII: Pupils were equal round reactive to light extraocular movements were full, Right visual field cut. facial sensation and strength were normal. hearing was intact to finger rubbing bilaterally. Uvula tongue midline. head turning and shoulder shrug and were normal and symmetric.Tongue protrusion into cheek strength was normal. Motor: normal tone, bulk and strength. Right hemi-neglect  Sensory: intact to light touch Coordination: Normal finger to nose, heel-to-shin bilaterally there was no truncal ataxia Gait: Leaning forward, difficulty to clear his feet from the floor, cautious Romberg signs: Negative Deep tendon reflexes: Brachioradialis 2/2, biceps 2/2, triceps 2/2,  patellar 2/2, Achilles Absent, plantar responses were flexor bilaterally.  DIAGNOSTIC DATA (LABS, IMAGING, TESTING) - I reviewed patient records, labs, notes, testing and imaging myself where available.  Lab Results  Component Value Date   WBC 6.1 04/26/2015   HGB 12.4 (L) 04/26/2015   HCT 38.0 (L) 04/26/2015   MCV 88.0 04/26/2015   PLT 226 04/26/2015      Component Value Date/Time   NA 143 04/26/2015 1436   K 4.5 04/26/2015 1436   CL 105 04/26/2015 1436   CO2 26 04/26/2015 1436   GLUCOSE 69 04/26/2015 1436   BUN 13 04/26/2015 1436   CREATININE 1.06 04/26/2015 1436   CALCIUM 8.8 04/26/2015 1436   PROT 6.6 07/23/2014 1856   ALBUMIN 3.1 (L) 07/23/2014 1856   AST 24 07/23/2014 1856   ALT 17 07/23/2014 1856   ALKPHOS 69 07/23/2014 1856   BILITOT 0.5 07/23/2014 1856   GFRNONAA 55 (L) 07/23/2014 1856   GFRAA 64 (L) 07/23/2014 1856    ASSESSMENT AND PLAN 80 y.o. year old male History of stroke  Multiple vascular risk factors, atrial fibrillation, hypertension, hyperlipidemia, diabetes Advanced dementia Gait abnormality Atrial fibrillation  On chronic Coumadin treatment Urinary incontinence  Add on Vesicare 5 mg every night  Marcial Pacas, M.D. Ph.D.  [facility] [place], [place] [address] Phone: [phone] Fax:      [phone]

## 2016-03-19 DIAGNOSIS — R4182 Altered mental status, unspecified: Secondary | ICD-10-CM | POA: Diagnosis not present

## 2016-03-19 DIAGNOSIS — I63522 Cerebral infarction due to unspecified occlusion or stenosis of left anterior cerebral artery: Secondary | ICD-10-CM | POA: Diagnosis not present

## 2016-07-02 ENCOUNTER — Encounter (HOSPITAL_COMMUNITY): Payer: Self-pay | Admitting: Emergency Medicine

## 2016-07-02 ENCOUNTER — Emergency Department (HOSPITAL_COMMUNITY): Payer: Medicare Other

## 2016-07-02 ENCOUNTER — Observation Stay (HOSPITAL_COMMUNITY)
Admission: EM | Admit: 2016-07-02 | Discharge: 2016-07-04 | Disposition: A | Discharge status: Home / Self Care | Payer: Medicare Other | Attending: Internal Medicine | Admitting: Internal Medicine

## 2016-07-02 DIAGNOSIS — Z7984 Long term (current) use of oral hypoglycemic drugs: Secondary | ICD-10-CM | POA: Diagnosis not present

## 2016-07-02 DIAGNOSIS — F329 Major depressive disorder, single episode, unspecified: Secondary | ICD-10-CM | POA: Insufficient documentation

## 2016-07-02 DIAGNOSIS — I119 Hypertensive heart disease without heart failure: Secondary | ICD-10-CM | POA: Diagnosis not present

## 2016-07-02 DIAGNOSIS — I700 Atherosclerosis of aorta: Secondary | ICD-10-CM | POA: Diagnosis not present

## 2016-07-02 DIAGNOSIS — Z950 Presence of cardiac pacemaker: Secondary | ICD-10-CM | POA: Insufficient documentation

## 2016-07-02 DIAGNOSIS — G9340 Encephalopathy, unspecified: Principal | ICD-10-CM | POA: Diagnosis present

## 2016-07-02 DIAGNOSIS — E785 Hyperlipidemia, unspecified: Secondary | ICD-10-CM | POA: Diagnosis present

## 2016-07-02 DIAGNOSIS — E118 Type 2 diabetes mellitus with unspecified complications: Secondary | ICD-10-CM

## 2016-07-02 DIAGNOSIS — R404 Transient alteration of awareness: Secondary | ICD-10-CM | POA: Diagnosis not present

## 2016-07-02 DIAGNOSIS — Z87891 Personal history of nicotine dependence: Secondary | ICD-10-CM | POA: Insufficient documentation

## 2016-07-02 DIAGNOSIS — I672 Cerebral atherosclerosis: Secondary | ICD-10-CM | POA: Insufficient documentation

## 2016-07-02 DIAGNOSIS — F0390 Unspecified dementia without behavioral disturbance: Secondary | ICD-10-CM | POA: Diagnosis not present

## 2016-07-02 DIAGNOSIS — I4891 Unspecified atrial fibrillation: Secondary | ICD-10-CM | POA: Diagnosis not present

## 2016-07-02 DIAGNOSIS — Z8673 Personal history of transient ischemic attack (TIA), and cerebral infarction without residual deficits: Secondary | ICD-10-CM

## 2016-07-02 DIAGNOSIS — E119 Type 2 diabetes mellitus without complications: Secondary | ICD-10-CM

## 2016-07-02 DIAGNOSIS — G40909 Epilepsy, unspecified, not intractable, without status epilepticus: Secondary | ICD-10-CM | POA: Diagnosis not present

## 2016-07-02 DIAGNOSIS — Z7901 Long term (current) use of anticoagulants: Secondary | ICD-10-CM | POA: Insufficient documentation

## 2016-07-02 DIAGNOSIS — R4182 Altered mental status, unspecified: Secondary | ICD-10-CM

## 2016-07-02 DIAGNOSIS — R531 Weakness: Secondary | ICD-10-CM | POA: Diagnosis not present

## 2016-07-02 DIAGNOSIS — F32A Depression, unspecified: Secondary | ICD-10-CM | POA: Diagnosis present

## 2016-07-02 DIAGNOSIS — G3189 Other specified degenerative diseases of nervous system: Secondary | ICD-10-CM | POA: Diagnosis not present

## 2016-07-02 DIAGNOSIS — R569 Unspecified convulsions: Secondary | ICD-10-CM

## 2016-07-02 LAB — CBC WITH DIFFERENTIAL/PLATELET
BASOS ABS: 0 10*3/uL (ref 0.0–0.1)
BASOS PCT: 0 %
EOS ABS: 0.2 10*3/uL (ref 0.0–0.7)
Eosinophils Relative: 3 %
HEMATOCRIT: 37 % — AB (ref 39.0–52.0)
HEMOGLOBIN: 11.8 g/dL — AB (ref 13.0–17.0)
Lymphocytes Relative: 16 %
Lymphs Abs: 0.9 10*3/uL (ref 0.7–4.0)
MCH: 30.1 pg (ref 26.0–34.0)
MCHC: 31.9 g/dL (ref 30.0–36.0)
MCV: 94.4 fL (ref 78.0–100.0)
Monocytes Absolute: 0.5 10*3/uL (ref 0.1–1.0)
Monocytes Relative: 9 %
NEUTROS ABS: 4 10*3/uL (ref 1.7–7.7)
NEUTROS PCT: 72 %
Platelets: 198 10*3/uL (ref 150–400)
RBC: 3.92 MIL/uL — ABNORMAL LOW (ref 4.22–5.81)
RDW: 13.7 % (ref 11.5–15.5)
WBC: 5.6 10*3/uL (ref 4.0–10.5)

## 2016-07-02 LAB — URINALYSIS, ROUTINE W REFLEX MICROSCOPIC
BILIRUBIN URINE: NEGATIVE
Glucose, UA: NEGATIVE mg/dL
HGB URINE DIPSTICK: NEGATIVE
KETONES UR: NEGATIVE mg/dL
Leukocytes, UA: NEGATIVE
Nitrite: NEGATIVE
PH: 5 (ref 5.0–8.0)
Protein, ur: NEGATIVE mg/dL
SPECIFIC GRAVITY, URINE: 1.021 (ref 1.005–1.030)

## 2016-07-02 LAB — RAPID URINE DRUG SCREEN, HOSP PERFORMED
AMPHETAMINES: NOT DETECTED
BARBITURATES: NOT DETECTED
BENZODIAZEPINES: NOT DETECTED
Cocaine: NOT DETECTED
Opiates: NOT DETECTED
Tetrahydrocannabinol: NOT DETECTED

## 2016-07-02 LAB — COMPREHENSIVE METABOLIC PANEL
ALBUMIN: 2.8 g/dL — AB (ref 3.5–5.0)
ALK PHOS: 70 U/L (ref 38–126)
ALT: 16 U/L — AB (ref 17–63)
ANION GAP: 7 (ref 5–15)
AST: 22 U/L (ref 15–41)
BUN: 19 mg/dL (ref 6–20)
CALCIUM: 8.7 mg/dL — AB (ref 8.9–10.3)
CHLORIDE: 109 mmol/L (ref 101–111)
CO2: 27 mmol/L (ref 22–32)
CREATININE: 1.24 mg/dL (ref 0.61–1.24)
GFR calc non Af Amer: 54 mL/min — ABNORMAL LOW (ref >60)
GLUCOSE: 153 mg/dL — AB (ref 65–99)
Potassium: 4.2 mmol/L (ref 3.5–5.1)
SODIUM: 143 mmol/L (ref 135–145)
Total Bilirubin: 0.5 mg/dL (ref 0.3–1.2)
Total Protein: 6.5 g/dL (ref 6.5–8.1)

## 2016-07-02 LAB — GLUCOSE, CAPILLARY: Glucose-Capillary: 79 mg/dL (ref 65–99)

## 2016-07-02 LAB — I-STAT ARTERIAL BLOOD GAS, ED
BICARBONATE: 25.1 mmol/L (ref 20.0–28.0)
O2 SAT: 96 %
PCO2 ART: 42.6 mmHg (ref 32.0–48.0)
Patient temperature: 97.4
TCO2: 26 mmol/L (ref 0–100)
pH, Arterial: 7.375 (ref 7.350–7.450)
pO2, Arterial: 79 mmHg — ABNORMAL LOW (ref 83.0–108.0)

## 2016-07-02 LAB — AMMONIA: AMMONIA: 32 umol/L (ref 9–35)

## 2016-07-02 LAB — PROTIME-INR
INR: 1.69
PROTHROMBIN TIME: 20.1 s — AB (ref 11.4–15.2)

## 2016-07-02 LAB — ETHANOL

## 2016-07-02 LAB — TROPONIN I: Troponin I: <0.03 ng/mL (ref <0.03)

## 2016-07-02 LAB — CBG MONITORING, ED: Glucose-Capillary: 160 mg/dL — ABNORMAL HIGH (ref 65–99)

## 2016-07-02 MED ORDER — ESCITALOPRAM OXALATE 10 MG PO TABS
10.0000 mg | ORAL_TABLET | Freq: Every day | ORAL | Status: DC
  Administered 2016-07-03 – 2016-07-04 (×2): 10 mg via ORAL
  Filled 2016-07-02 (×2): qty 1

## 2016-07-02 MED ORDER — INSULIN ASPART 100 UNIT/ML ~~LOC~~ SOLN
0.0000 [IU] | Freq: Three times a day (TID) | SUBCUTANEOUS | Status: DC
  Administered 2016-07-03: 3 [IU] via SUBCUTANEOUS
  Administered 2016-07-03: 2 [IU] via SUBCUTANEOUS
  Administered 2016-07-04: 3 [IU] via SUBCUTANEOUS

## 2016-07-02 MED ORDER — AMLODIPINE BESYLATE 2.5 MG PO TABS
2.5000 mg | ORAL_TABLET | Freq: Every day | ORAL | Status: DC
  Administered 2016-07-03 – 2016-07-04 (×2): 2.5 mg via ORAL
  Filled 2016-07-02 (×2): qty 1

## 2016-07-02 MED ORDER — ATORVASTATIN CALCIUM 40 MG PO TABS
40.0000 mg | ORAL_TABLET | Freq: Every day | ORAL | Status: DC
  Administered 2016-07-02 – 2016-07-03 (×2): 40 mg via ORAL
  Filled 2016-07-02 (×3): qty 1

## 2016-07-02 MED ORDER — WARFARIN SODIUM 3 MG PO TABS: 3.0000 mg | ORAL_TABLET | Freq: Once | ORAL | Status: DC

## 2016-07-02 MED ORDER — LEVETIRACETAM 500 MG PO TABS
500.0000 mg | ORAL_TABLET | Freq: Two times a day (BID) | ORAL | Status: DC
  Administered 2016-07-02 – 2016-07-04 (×4): 500 mg via ORAL
  Filled 2016-07-02 (×4): qty 1

## 2016-07-02 MED ORDER — LISINOPRIL 20 MG PO TABS
20.0000 mg | ORAL_TABLET | Freq: Every day | ORAL | Status: DC
  Administered 2016-07-03 – 2016-07-04 (×2): 20 mg via ORAL
  Filled 2016-07-02 (×2): qty 1

## 2016-07-02 MED ORDER — FOLIC ACID 1 MG PO TABS
1.0000 mg | ORAL_TABLET | Freq: Every day | ORAL | Status: DC
  Administered 2016-07-03 – 2016-07-04 (×2): 1 mg via ORAL
  Filled 2016-07-02 (×2): qty 1

## 2016-07-02 MED ORDER — WARFARIN - PHARMACIST DOSING INPATIENT: Freq: Every day | Status: DC

## 2016-07-02 NOTE — ED Notes (Signed)
RT called about ABG

## 2016-07-02 NOTE — ED Provider Notes (Signed)
[facility] DEPT Provider Note   CSN: [id number] Arrival date & time: 07/02/16  1151   LEVEL 5 CAVEAT - ALTERED MENTAL STATUS  History   Chief Complaint Chief Complaint  Patient presents with  . Altered Mental Status    HPI Brent Dunn is a 81 y.o. male.  HPI  81 year old male with a history of atrial fibrillation, dementia, and diabetes presents with altered mental status. Reports from the nurse to talk to EMS. Family is not currently present and patient does not know why he is here. Per EMS, the patient was last seen normal last night. When family first saw him this morning he was laying on his left side on the bed. He typically can do ADLs such as bathing and brushing teeth and getting dressed. Normally ambulates. However family had to do all this for him this morning and he did not seem capable. He seems more lethargic. Patient tells me that he is in the hospital when asked but does not know why he is here. He denies pain when asked.  Past Medical History:  Diagnosis Date  . Atrial fibrillation ([place])   . Dementia   . Depression   . Diabetes mellitus type 2, controlled ([place])   . High blood pressure   . High cholesterol   . History of stroke   . Stroke [facility])     Patient Active Problem List   Diagnosis Date Noted  . Acute encephalopathy 07/02/2016  . Diabetes mellitus, type 2 ([place]) 07/02/2016  . History of stroke 07/12/2015  . HLD (hyperlipidemia) 06/18/2015  . Dementia 04/27/2014  . Pacemaker 08/26/2013  . Stroke ([place]) 11/07/2012  . Seizures ([place]) 11/07/2012  . Atrial fibrillation ([place])   . High blood pressure   . Depression     Past Surgical History:  Procedure Laterality Date  . APPENDECTOMY    . EP IMPLANTABLE DEVICE N/A 04/27/2015   Procedure: PPM Generator Changeout;  Surgeon: Evans Lance, MD;  Location: [facility] CV LAB;  Service: Cardiovascular;  Laterality: N/A;  . PACEMAKER INSERTION         Home Medications    Prior to Admission  medications   Medication Sig Start Date End Date Taking? Authorizing Provider  amLODipine (NORVASC) 2.5 MG tablet Take 2.5 mg by mouth daily. 06/20/13  Yes Historical Provider, MD  atorvastatin (LIPITOR) 40 MG tablet Take 40 mg by mouth at bedtime.    Yes Historical Provider, MD  escitalopram (LEXAPRO) 10 MG tablet Take 10 mg by mouth daily.   Yes Historical Provider, MD  folic acid (FOLVITE) 1 MG tablet Take 1 mg by mouth daily.   Yes Historical Provider, MD  levETIRAcetam (KEPPRA) 500 MG tablet Take 1 tablet (500 mg total) by mouth every 12 (twelve) hours. 03/14/16  Yes Marcial Pacas, MD  lisinopril (PRINIVIL,ZESTRIL) 20 MG tablet Take 20 mg by mouth daily.   Yes Historical Provider, MD  metformin (FORTAMET) 500 MG (OSM) 24 hr tablet Take 500 mg by mouth 2 (two) times daily with a meal.    Yes Historical Provider, MD  warfarin (COUMADIN) 6 MG tablet Take 6 mg by mouth daily. Take as directed by the coumadin clinic   Yes Historical Provider, MD    Family History Family History  Problem Relation Age of Onset  . Stroke Father   . High blood pressure Father   . Stroke Mother   . Diabetes Sister   . Cancer Brother     colon    Social  History Social History  Substance Use Topics  . Smoking status: Former Smoker    Types: Cigarettes  . Smokeless tobacco: Never Used     Comment: 1980's  . Alcohol use No     Allergies   Patient has no known allergies.   Review of Systems Review of Systems  Unable to perform ROS: Dementia     Physical Exam Updated Vital Signs BP 150/89   Pulse 91   Temp 97.4 F (36.3 C) (Rectal)   Resp 14   Ht 5\' 8"  (1.727 m)   SpO2 99%   Physical Exam  Constitutional: He appears well-developed and well-nourished.  HENT:  Head: Normocephalic and atraumatic.  Right Ear: External ear normal.  Left Ear: External ear normal.  Nose: Nose normal.  Eyes: Right eye exhibits no discharge. Left eye exhibits no discharge.  Neck: Neck supple.  Cardiovascular:  Normal rate and normal heart sounds.  An irregular rhythm present.  Pulmonary/Chest: Effort normal and breath sounds normal.  Abdominal: Soft. There is no tenderness.  Musculoskeletal: He exhibits no edema.  Neurological: He is alert. He is disoriented (oriented to person and place).  Awake, alert, appears somewhat somnolent but easily arousable. No facial droop. 5/5 strength in BUE. BLE weak, barely able to get off bed but symmetric. Poor effort throughout, slow movements.  Skin: Skin is warm and dry.  Nursing note and vitals reviewed.    ED Treatments / Results  Labs (all labs ordered are listed, but only abnormal results are displayed) Labs Reviewed  CBC WITH DIFFERENTIAL/PLATELET - Abnormal; Notable for the following:       Result Value   RBC 3.92 (*)    Hemoglobin 11.8 (*)    HCT 37.0 (*)    All other components within normal limits  COMPREHENSIVE METABOLIC PANEL - Abnormal; Notable for the following:    Glucose, Bld 153 (*)    Calcium 8.7 (*)    Albumin 2.8 (*)    ALT 16 (*)    GFR calc non Af Amer 54 (*)    All other components within normal limits  PROTIME-INR - Abnormal; Notable for the following:    Prothrombin Time 20.1 (*)    All other components within normal limits  URINALYSIS, ROUTINE W REFLEX MICROSCOPIC - Abnormal; Notable for the following:    APPearance CLOUDY (*)    All other components within normal limits  CBG MONITORING, ED - Abnormal; Notable for the following:    Glucose-Capillary 160 (*)    All other components within normal limits  I-STAT ARTERIAL BLOOD GAS, ED - Abnormal; Notable for the following:    pO2, Arterial 79.0 (*)    All other components within normal limits  AMMONIA  RAPID URINE DRUG SCREEN, HOSP PERFORMED  ETHANOL  TROPONIN I  BASIC METABOLIC PANEL  CBC  HEMOGLOBIN A1C  PROTIME-INR    EKG  EKG Interpretation  Date/Time:  Sunday July 02 2016 12:00:00 EST Ventricular Rate:  102 PR Interval:    QRS Duration: 149 QT  Interval:  383 QTC Calculation: 499 R Axis:   -48 Text Interpretation:  Atrial fibrillation RBBB and LAFB Left ventricular hypertrophy No old tracing to compare Confirmed by [place] MD, [place] [phone]) on 07/02/2016 12:03:25 PM       Radiology Dg Chest 2 View  Result Date: 07/02/2016 CLINICAL DATA:  Altered mental status; h/o HTN and DM; former smoker EXAM: CHEST  2 VIEW COMPARISON:  07/23/2014 FINDINGS: Pacer with leads at right atrium and  right ventricle. No lead discontinuity. Midline trachea. Mild cardiomegaly. Atherosclerosis in the transverse aorta. No pleural effusion or pneumothorax. No congestive failure. No lobar consolidation. IMPRESSION: No acute cardiopulmonary disease. Cardiomegaly without congestive failure. Aortic atherosclerosis. Electronically Signed   By: Abigail Miyamoto M.D.   On: 07/02/2016 13:23   Ct Head Wo Contrast  Result Date: 07/02/2016 CLINICAL DATA:  Altered mental status and history of dementia. EXAM: CT HEAD WITHOUT CONTRAST TECHNIQUE: Contiguous axial images were obtained from the base of the skull through the vertex without intravenous contrast. COMPARISON:  02/12/2014 FINDINGS: Brain: Advanced cerebral atrophy. Encephalomalacia involving the left occipital lobe is consistent with remote infarct. Moderate low density in the periventricular white matter likely related to small vessel disease. There is also remote left cerebellar hemisphere infarct with resultant encephalomalacia. Smaller volume infarct in the right PCA distribution. Remote lacunar infarcts of the left basal ganglia. No mass lesion, hemorrhage, hydrocephalus, acute infarct, intra-axial, or extra-axial fluid collection. Vascular: Bilateral vertebral and carotid intracranial atherosclerosis. Skull: Normal Sinuses/Orbits: Normal orbits and globes. Mucosal thickening of sphenoid sinus. Clear mastoid air cells. Other: None IMPRESSION: 1.  No acute intracranial abnormality. 2.  Cerebral atrophy and small vessel  ischemic change. 3. Remote infarcts involving the bilateral occipital lobes and left cerebellar hemisphere. 4. Sinus disease. Electronically Signed   By: Abigail Miyamoto M.D.   On: 07/02/2016 13:22    Procedures Procedures (including critical care time)  Medications Ordered in ED Medications  levETIRAcetam (KEPPRA) tablet 500 mg (not administered)  folic acid (FOLVITE) tablet 1 mg (not administered)  lisinopril (PRINIVIL,ZESTRIL) tablet 20 mg (not administered)  amLODipine (NORVASC) tablet 2.5 mg (not administered)  atorvastatin (LIPITOR) tablet 40 mg (not administered)  escitalopram (LEXAPRO) tablet 10 mg (not administered)  insulin aspart (novoLOG) injection 0-15 Units (not administered)  warfarin (COUMADIN) tablet 3 mg (not administered)  Warfarin - Pharmacist Dosing Inpatient (not administered)     Initial Impression / Assessment and Plan / ED Course  I have reviewed the triage vital signs and the nursing notes.  Pertinent labs & imaging results that were available during my care of the patient were reviewed by me and considered in my medical decision making (see chart for details).  Clinical Course as of Jul 02 1829  Sun Jul 02, 2016  1207 Patient presents with mental status changes. Unknown baseline as family is not currently present but apparently on the way. Nothing focal on my exam. We'll start altered mental status workup. Afebrile.  [SG]    Clinical Course User Index [SG] Sherwood Gambler, MD    Workup unremarkable. I discussed with niece who updates that patient fell today. Initially found on floor this AM, then seemed confused. Also fell into his sister. No obvious syncope/LOC. However continues to not act like himself. Plan to admit for altered mental status. [facility] to admit.  Final Clinical Impressions(s) / ED Diagnoses   Final diagnoses:  Altered mental status, unspecified altered mental status type    New Prescriptions Current Discharge Medication List         Sherwood Gambler, MD 07/02/16 [phone]

## 2016-07-02 NOTE — ED Triage Notes (Addendum)
Patient presents today from home with complaints of AMS. Patient has hx of dementia, Per EMS patient family states patient appears" more sleepy" and unable to do aDL's  Which is not baseline. Patient able to state, patient unable to states Time and place. Patient has to awaken by staff. Patient unable to follow commands. Patient does have Hx of CVA with left sided weakness.

## 2016-07-02 NOTE — Progress Notes (Signed)
ANTICOAGULATION CONSULT NOTE - Initial Consult  Pharmacy Consult for Coumadin Indication: atrial fibrillation  No Known Allergies  Patient Measurements: Height: 5\' 8"  (172.7 cm) IBW/kg (Calculated) : 68.4   Vital Signs: Temp: 97.4 F (36.3 C) (01/14 1232) Temp Source: Rectal (01/14 1232) BP: 150/89 (01/14 1700) Pulse Rate: 91 (01/14 1700)  Labs:  Recent Labs  07/02/16 1254 07/02/16 1257  HGB 11.8*  --   HCT 37.0*  --   PLT 198  --   LABPROT 20.1*  --   INR 1.69  --   CREATININE 1.24  --   TROPONINI  --  <0.03    CrCl cannot be calculated (Unknown ideal weight.).   Medical History: Past Medical History:  Diagnosis Date  . Atrial fibrillation ([place])   . Dementia   . Depression   . Diabetes mellitus type 2, controlled ([place])   . High blood pressure   . High cholesterol   . History of stroke   . Stroke [facility])     Assessment: 81 year old male on Coumadin PTA for Afib to resume. Admitted with AMS, no bleed noted Admit INR = 1.69, Dose PTA = 6 mg po daily Has taken dose today  Goal of Therapy:  INR 2-3 Monitor platelets by anticoagulation protocol: Yes   Plan:  Coumadin 3 mg po x 1 now Daily INR  Thank you Anette Guarneri, PharmD [phone]   07/02/2016,6:12 PM

## 2016-07-02 NOTE — ED Notes (Signed)
Charge RN at bedside attempting to Cath.

## 2016-07-02 NOTE — ED Notes (Signed)
Assisted Janee', RN with in and out cath; Carmelina [place]', RN was unsuccessful to obtain urine sample; placed urinal back in between legs of patient; visitor stayed at bedside for event

## 2016-07-02 NOTE — ED Notes (Signed)
This RN and EMT Sherita attempted to Cath patient unsuccessful. MD made aware.

## 2016-07-02 NOTE — H&P (Signed)
History and Physical    Brent Dunn [id number] DOB: 09-Jan-1936 DOA: 07/02/2016  PCP: Gara Kroner, MD  Patient coming from: home  Chief Complaint: altered mental status   HPI: Brent Dunn is a 81 y.o. male with medical history significant of seizure disorder, depression, hyperlipidemia, hypertension, atrial fibrillation on Coumadin therapy, who presents to the hospital today due to altered mental status. Per report, patient has dementia at baseline, but is able to function in activities of daily living. Today however, family noticed that patient was not acting himself, more sleepy. Family is not at bedside and patient is unable to tell me his history, unable to reach family. Reports gathered from ED staff and physician notes. Patient is alert, oriented to self only. Denies any complaints and says "no" to all questions asked pertaining to ROS. Cannot tell me why he is in the hospital.   ED Course: CT head and labs were obtained.  Review of Systems: As per HPI otherwise 10 point review of systems negative.   Past Medical History:  Diagnosis Date  . Atrial fibrillation ([place])   . Dementia   . Depression   . Diabetes mellitus type 2, controlled ([place])   . High blood pressure   . High cholesterol   . History of stroke   . Stroke [facility])     Past Surgical History:  Procedure Laterality Date  . APPENDECTOMY    . EP IMPLANTABLE DEVICE N/A 04/27/2015   Procedure: PPM Generator Changeout;  Surgeon: Evans Lance, MD;  Location: [facility] CV LAB;  Service: Cardiovascular;  Laterality: N/A;  . PACEMAKER INSERTION       reports that he has quit smoking. His smoking use included Cigarettes. He has never used smokeless tobacco. He reports that he does not drink alcohol or use drugs.  No Known Allergies  Family History  Problem Relation Age of Onset  . Stroke Father   . High blood pressure Father   . Stroke Mother   . Diabetes Sister   . Cancer Brother     colon   Prior to  Admission medications   Medication Sig Start Date End Date Taking? Authorizing Provider  amLODipine (NORVASC) 2.5 MG tablet Take 2.5 mg by mouth daily. 06/20/13  Yes Historical Provider, MD  atorvastatin (LIPITOR) 40 MG tablet Take 40 mg by mouth at bedtime.    Yes Historical Provider, MD  escitalopram (LEXAPRO) 10 MG tablet Take 10 mg by mouth daily.   Yes Historical Provider, MD  folic acid (FOLVITE) 1 MG tablet Take 1 mg by mouth daily.   Yes Historical Provider, MD  levETIRAcetam (KEPPRA) 500 MG tablet Take 1 tablet (500 mg total) by mouth every 12 (twelve) hours. 03/14/16  Yes Marcial Pacas, MD  lisinopril (PRINIVIL,ZESTRIL) 20 MG tablet Take 20 mg by mouth daily.   Yes Historical Provider, MD  metformin (FORTAMET) 500 MG (OSM) 24 hr tablet Take 500 mg by mouth 2 (two) times daily with a meal.    Yes Historical Provider, MD  warfarin (COUMADIN) 6 MG tablet Take 6 mg by mouth daily. Take as directed by the coumadin clinic   Yes Historical Provider, MD  solifenacin (VESICARE) 5 MG tablet Take 1 tablet (5 mg total) by mouth daily. Patient not taking: Reported on 07/02/2016 03/14/16   Marcial Pacas, MD    Physical Exam: Vitals:   07/02/16 1400 07/02/16 1415 07/02/16 1430 07/02/16 1500  BP: 148/88 152/91 150/95 148/95  Pulse: 78 (!) 45 (!) 59 (!) 39  Resp: 18 20 15 17   Temp:      TempSrc:      SpO2: 100% 100% 100% 100%  Height:        Constitutional: NAD, calm, comfortable Eyes: PERRL, lids and conjunctivae normal ENMT: Mucous membranes are moist. Posterior pharynx clear of any exudate or lesions.Normal dentition.  Neck: normal, supple, no masses, no thyromegaly Respiratory: clear to auscultation bilaterally, no wheezing, no crackles. Normal respiratory effort. No accessory muscle use.  Cardiovascular: Irregular rhythm, tachycardic rate in 100s, no murmurs / rubs / gallops. No extremity edema. 2+ pedal pulses.  Abdomen: no tenderness, no masses palpated. No hepatosplenomegaly. Bowel sounds  positive.  Musculoskeletal: no clubbing / cyanosis. No joint deformity upper and lower extremities. Good ROM, no contractures. Normal muscle tone.  Skin: no rashes, lesions, ulcers. No induration Neurologic: CN 2-12 grossly intact. Follows all commands appropriately, no focal deficits, alert and oriented to self only.   Labs on Admission: I have personally reviewed following labs and imaging studies  CBC:  Recent Labs Lab 07/02/16 1254  WBC 5.6  NEUTROABS 4.0  HGB 11.8*  HCT 37.0*  MCV 94.4  PLT 99991111   Basic Metabolic Panel:  Recent Labs Lab 07/02/16 1254  NA 143  K 4.2  CL 109  CO2 27  GLUCOSE 153*  BUN 19  CREATININE 1.24  CALCIUM 8.7*   GFR: CrCl cannot be calculated (Unknown ideal weight.). Liver Function Tests:  Recent Labs Lab 07/02/16 1254  AST 22  ALT 16*  ALKPHOS 70  BILITOT 0.5  PROT 6.5  ALBUMIN 2.8*   No results for input(s): LIPASE, AMYLASE in the last 168 hours.  Recent Labs Lab 07/02/16 1252  AMMONIA 32   Coagulation Profile:  Recent Labs Lab 07/02/16 1254  INR 1.69   Cardiac Enzymes:  Recent Labs Lab 07/02/16 1257  TROPONINI <0.03   BNP (last 3 results) No results for input(s): PROBNP in the last 8760 hours. HbA1C: No results for input(s): HGBA1C in the last 72 hours. CBG:  Recent Labs Lab 07/02/16 1207  GLUCAP 160*   Lipid Profile: No results for input(s): CHOL, HDL, LDLCALC, TRIG, CHOLHDL, LDLDIRECT in the last 72 hours. Thyroid Function Tests: No results for input(s): TSH, T4TOTAL, FREET4, T3FREE, THYROIDAB in the last 72 hours. Anemia Panel: No results for input(s): VITAMINB12, FOLATE, FERRITIN, TIBC, IRON, RETICCTPCT in the last 72 hours. Urine analysis:    Component Value Date/Time   COLORURINE YELLOW 07/02/2016 1210   APPEARANCEUR CLOUDY (A) 07/02/2016 1210   LABSPEC 1.021 07/02/2016 1210   PHURINE 5.0 07/02/2016 1210   GLUCOSEU NEGATIVE 07/02/2016 1210   HGBUR NEGATIVE 07/02/2016 1210   BILIRUBINUR  NEGATIVE 07/02/2016 1210   KETONESUR NEGATIVE 07/02/2016 1210   PROTEINUR NEGATIVE 07/02/2016 1210   UROBILINOGEN 1.0 07/23/2014 2009   NITRITE NEGATIVE 07/02/2016 1210   LEUKOCYTESUR NEGATIVE 07/02/2016 1210   Sepsis Labs: !!!!!!!!!!!!!!!!!!!!!!!!!!!!!!!!!!!!!!!!!!!! @LABRCNTIP (procalcitonin:4,lacticidven:4) )No results found for this or any previous visit (from the past 240 hour(s)).   Radiological Exams on Admission: Dg Chest 2 View  Result Date: 07/02/2016 CLINICAL DATA:  Altered mental status; h/o HTN and DM; former smoker EXAM: CHEST  2 VIEW COMPARISON:  07/23/2014 FINDINGS: Pacer with leads at right atrium and right ventricle. No lead discontinuity. Midline trachea. Mild cardiomegaly. Atherosclerosis in the transverse aorta. No pleural effusion or pneumothorax. No congestive failure. No lobar consolidation. IMPRESSION: No acute cardiopulmonary disease. Cardiomegaly without congestive failure. Aortic atherosclerosis. Electronically Signed   By: Adria Devon.D.  On: 07/02/2016 13:23   Ct Head Wo Contrast  Result Date: 07/02/2016 CLINICAL DATA:  Altered mental status and history of dementia. EXAM: CT HEAD WITHOUT CONTRAST TECHNIQUE: Contiguous axial images were obtained from the base of the skull through the vertex without intravenous contrast. COMPARISON:  02/12/2014 FINDINGS: Brain: Advanced cerebral atrophy. Encephalomalacia involving the left occipital lobe is consistent with remote infarct. Moderate low density in the periventricular white matter likely related to small vessel disease. There is also remote left cerebellar hemisphere infarct with resultant encephalomalacia. Smaller volume infarct in the right PCA distribution. Remote lacunar infarcts of the left basal ganglia. No mass lesion, hemorrhage, hydrocephalus, acute infarct, intra-axial, or extra-axial fluid collection. Vascular: Bilateral vertebral and carotid intracranial atherosclerosis. Skull: Normal Sinuses/Orbits: Normal  orbits and globes. Mucosal thickening of sphenoid sinus. Clear mastoid air cells. Other: None IMPRESSION: 1.  No acute intracranial abnormality. 2.  Cerebral atrophy and small vessel ischemic change. 3. Remote infarcts involving the bilateral occipital lobes and left cerebellar hemisphere. 4. Sinus disease. Electronically Signed   By: Abigail Miyamoto M.D.   On: 07/02/2016 13:22    EKG: Independently reviewed. Atrial fibrillation, rate 100   Assessment/Plan Principal Problem:   Acute encephalopathy Active Problems:   Atrial fibrillation ([facility])   Depression   Seizures ([facility])   Dementia   History of stroke   HLD (hyperlipidemia)   Diabetes mellitus, type 2 ([facility])   Acute encephalopathy -Has baseline dementia, but is able to function in activities of daily living. From previous neurology notes, patient has had history of memory loss, progressive dementia, does not typically carry a conversation, is not oriented. Last MMSE was 7/30. Per family, patient apparently is now "not acting himself" although cannot further characterize the problem. Unclear etiology of AMS, could be that his dementia is progressing. Will monitor   Atrial fibrillation -Continue Coumadin per pharmacy  Seizure disorder -Continue Keppra  Essential hypertension -Continue Norvasc, lisinopril  Diabetes mellitus type 2 -Hold metformin -Check Ha1c  -Sliding-scale insulin  Hyperlipidemia -Continue Lipitor  Depression -Continue Lexapro   DVT prophylaxis: coumadin Code Status: Full (from prior, unable to confirm w patient due to mentation, unable to reach family by phone)   Family Communication: no family at bedside Disposition Plan: pending further work up, evaluation, and stabilization. May need placement.  Consults called: none  Admission status: observation   It is my clinical opinion that referral for OBSERVATION is reasonable and necessary in this 81 y.o. year old male  presenting with symptoms of AMS,  concerning for encephalopathy vs worsening dementia   in the context of PMH including HTN, DM, A Fib, HLD, depression, seizure  with pertinent physical exam findings of follows commands but does not answer questions appropriately, no focal deficits   The aforementioned taken together are felt to place the patient at high risk for further  clinical deterioration. However it is anticipated that the patient may be medically stable for discharge from the hospital within 24 to 48 hours.   Dessa Phi, DO [facility] [email or web address] Password [email or web address] 07/02/2016, 5:00 PM

## 2016-07-03 DIAGNOSIS — F0390 Unspecified dementia without behavioral disturbance: Secondary | ICD-10-CM

## 2016-07-03 DIAGNOSIS — I482 Chronic atrial fibrillation: Secondary | ICD-10-CM

## 2016-07-03 DIAGNOSIS — G9340 Encephalopathy, unspecified: Secondary | ICD-10-CM | POA: Diagnosis not present

## 2016-07-03 DIAGNOSIS — Z8673 Personal history of transient ischemic attack (TIA), and cerebral infarction without residual deficits: Secondary | ICD-10-CM | POA: Diagnosis not present

## 2016-07-03 LAB — CBC
HCT: 33.4 % — ABNORMAL LOW (ref 39.0–52.0)
Hemoglobin: 10.8 g/dL — ABNORMAL LOW (ref 13.0–17.0)
MCH: 29.8 pg (ref 26.0–34.0)
MCHC: 32.3 g/dL (ref 30.0–36.0)
MCV: 92 fL (ref 78.0–100.0)
PLATELETS: 172 10*3/uL (ref 150–400)
RBC: 3.63 MIL/uL — ABNORMAL LOW (ref 4.22–5.81)
RDW: 13.7 % (ref 11.5–15.5)
WBC: 4.7 10*3/uL (ref 4.0–10.5)

## 2016-07-03 LAB — GLUCOSE, CAPILLARY
Glucose-Capillary: 83 mg/dL (ref 65–99)
Glucose-Capillary: 191 mg/dL — ABNORMAL HIGH (ref 65–99)
Glucose-Capillary: 148 mg/dL — ABNORMAL HIGH (ref 65–99)
Glucose-Capillary: 72 mg/dL (ref 65–99)

## 2016-07-03 LAB — BASIC METABOLIC PANEL
Anion gap: 7 (ref 5–15)
BUN: 13 mg/dL (ref 6–20)
CHLORIDE: 109 mmol/L (ref 101–111)
CO2: 27 mmol/L (ref 22–32)
CREATININE: 0.99 mg/dL (ref 0.61–1.24)
Calcium: 8.5 mg/dL — ABNORMAL LOW (ref 8.9–10.3)
GFR calc Af Amer: >60 mL/min (ref >60)
GFR calc non Af Amer: >60 mL/min (ref >60)
Glucose, Bld: 90 mg/dL (ref 65–99)
Potassium: 3.8 mmol/L (ref 3.5–5.1)
SODIUM: 143 mmol/L (ref 135–145)

## 2016-07-03 LAB — HEMOGLOBIN A1C
Hgb A1c MFr Bld: 6.7 % — ABNORMAL HIGH (ref 4.8–5.6)
Mean Plasma Glucose: 146 mg/dL

## 2016-07-03 LAB — PROTIME-INR
INR: 1.88
PROTHROMBIN TIME: 21.9 s — AB (ref 11.4–15.2)

## 2016-07-03 MED ORDER — WARFARIN SODIUM 7.5 MG PO TABS
7.5000 mg | ORAL_TABLET | Freq: Once | ORAL | Status: AC
  Administered 2016-07-03: 7.5 mg via ORAL
  Filled 2016-07-03: qty 1

## 2016-07-03 NOTE — Care Management Obs Status (Signed)
[facility] NOTIFICATION   Patient Details  Name: Brent Dunn MRN: [id number] Date of Birth: [birth year]   Medicare Observation Status Notification Given:  Yes    Pollie Friar, RN 07/03/2016, 1:51 PM

## 2016-07-03 NOTE — Discharge Instructions (Signed)

## 2016-07-03 NOTE — Progress Notes (Signed)
ANTICOAGULATION CONSULT NOTE - FOLLOW UP  Pharmacy Consult:  Coumadin Indication: atrial fibrillation  No Known Allergies  Patient Measurements: Height: 5\' 8"  (172.7 cm) IBW/kg (Calculated) : 68.4  Vital Signs: Temp: 98 F (36.7 C) (01/15 1047) Temp Source: Oral (01/15 1047) BP: 127/75 (01/15 1047) Pulse Rate: 77 (01/15 1047)  Labs:  Recent Labs  07/02/16 1254 07/02/16 1257 07/03/16 0436  HGB 11.8*  --  10.8*  HCT 37.0*  --  33.4*  PLT 198  --  172  LABPROT 20.1*  --  21.9*  INR 1.69  --  1.88  CREATININE 1.24  --  0.99  TROPONINI  --  <0.03  --     CrCl cannot be calculated (Unknown ideal weight.).   Assessment: 21 YOM continues on Coumadin for history of Afib.  INR remains sub-therapeutic; no bleeding reported.   Goal of Therapy:  INR 2-3    Plan:  - Coumadin 7.5mg  PO today - Daily PT / INR   Taheerah Guldin D. Mina Marble, PharmD, BCPS Pager:  [phone] 07/03/2016, 11:02 AM

## 2016-07-03 NOTE — Evaluation (Signed)
Physical Therapy Evaluation Patient Details Name: Brent Dunn MRN: [id number] DOB: 01-02-1936 Today's Date: 07/03/2016   History of Present Illness  Brent Dunn is a 81 y.o. male with medical history significant of seizure disorder, depression, hyperlipidemia, hypertension, atrial fibrillation on Coumadin therapy, who presents to the hospital today due to altered mental status.  Encephalopathy  Clinical Impression   Pt admitted with above diagnosis. Pt currently with functional limitations due to the deficits listed below (see PT Problem List). Presents with functional dependencies, gait and balance dysfunction; will need better picture of PLOF and home situation to fully discern dc plan (see below);  Pt will benefit from skilled PT to increase their independence and safety with mobility to allow discharge to the venue listed below.       Follow Up Recommendations SNF (Will need fuller, clearer picture of home situation and available home assist; I tend to favor home with familiar environment, routine, and caregivers for patients with dementia; Will need to verify adequate assist at home; If unable to go home, SNF for rehab is an option as he did show participation and rehab potential this session)    Equipment Recommendations  Rolling walker with 5" wheels;3in1 (PT)    Recommendations for Other Services OT consult     Precautions / Restrictions Precautions Precautions: Fall      Mobility  Bed Mobility Overal bed mobility: Needs Assistance Bed Mobility: Rolling;Sidelying to Sit Rolling: Mod assist Sidelying to sit: Mod assist       General bed mobility comments: Verbal and tactile cueing for technique; Light mod assist to initiate movement; once initiated, noting good follow through to complete task with less need for cues  Transfers Overall transfer level: Needs assistance Equipment used: Rolling walker (2 wheeled) Transfers: Sit to/from Stand Sit to Stand: Min assist;Mod  assist         General transfer comment: Two reps of sit to stand; initially with min assist to steady, verbal and tactile cues for hand placement; more tired second rep at end of session, needed mod assist to power up  Ambulation/Gait Ambulation/Gait assistance: Min assist;Mod assist Ambulation Distance (Feet): 75 Feet Assistive device: Rolling walker (2 wheeled);1 person hand held assist Gait Pattern/deviations: Narrow base of support;Decreased step length - right;Decreased step length - left;Decreased stride length;Drifts right/left     General Gait Details: Needing tactile cueing and facilitation for straight path; Benefits from bil UE support on RW -- trialed without RW and then needed at least mod assist for balance  Stairs            Wheelchair Mobility    Modified Rankin (Stroke Patients Only)       Balance Overall balance assessment: Needs assistance Sitting-balance support: Bilateral upper extremity supported;Single extremity supported Sitting balance-Leahy Scale: Fair       Standing balance-Leahy Scale: Poor                               Pertinent Vitals/Pain Pain Assessment: Faces Faces Pain Scale: No hurt    Home Living Family/patient expects to be discharged to:: Unsure                 Additional Comments: Will need more information re: home situation and available assist at home    Prior Function           Comments: Unknown; pt is unable to give reliable info re: PLOF or home situation  Hand Dominance        Extremity/Trunk Assessment   Upper Extremity Assessment Upper Extremity Assessment: Generalized weakness (and very slow-moving)    Lower Extremity Assessment Lower Extremity Assessment: Generalized weakness (and slow-moving)    Cervical / Trunk Assessment Cervical / Trunk Assessment: Normal  Communication   Communication: Other (comment) (Minimally conversive, but did answer yes/no questions)   Cognition Arousal/Alertness: Awake/alert;Lethargic (Eyes open entire time with standing activities, once sitting would almost immediately close eyes) Behavior During Therapy: WFL for tasks assessed/performed;Flat affect Overall Cognitive Status: No family/caregiver present to determine baseline cognitive functioning                 General Comments: Did not answer verifiable biographical information about himself (i.e. birth month) correctly with multiple tries    General Comments      Exercises     Assessment/Plan    PT Assessment Patient needs continued PT services  PT Problem List Decreased strength;Decreased activity tolerance;Decreased balance;Decreased mobility;Decreased coordination;Decreased cognition;Decreased knowledge of use of DME;Decreased safety awareness;Decreased knowledge of precautions          PT Treatment Interventions DME instruction;Gait training;Stair training;Functional mobility training;Therapeutic activities;Therapeutic exercise;Balance training;Neuromuscular re-education;Cognitive remediation;Patient/family education    PT Goals (Current goals can be found in the Care Plan section)  Acute Rehab PT Goals Patient Stated Goal: Did not state, but agreeable to walk PT Goal Formulation: Patient unable to participate in goal setting Time For Goal Achievement: 07/17/16 Potential to Achieve Goals: Good    Frequency Min 3X/week   Barriers to discharge Other (comment) Will need fuller, clearer picture of home situation and available home assist; I tend to favor home with familiar environment, routine, and caregivers for patients with dementia; Will need to verify adequate assist at home; If unable to go home, SNF for rehab is an option as he did show rehab potential this session    Co-evaluation               End of Session Equipment Utilized During Treatment: Gait belt Activity Tolerance: Patient tolerated treatment well Patient left: in  chair;with call bell/phone within reach;with chair alarm set (Fell asleep almost immediately after sitting down) Nurse Communication: Mobility status    Functional Assessment Tool Used: Clinical Judgement Functional Limitation: Mobility: Walking and moving around Mobility: Walking and Moving Around Current Status [id number]): At least 20 percent but less than 40 percent impaired, limited or restricted Mobility: Walking and Moving Around Goal Status [phone]): At least 1 percent but less than 20 percent impaired, limited or restricted    Time: 0927-0941 PT Time Calculation (min) (ACUTE ONLY): 14 min   Charges:   PT Evaluation $PT Eval Moderate Complexity: 1 Procedure     PT G Codes:   PT G-Codes **NOT FOR INPATIENT CLASS** Functional Assessment Tool Used: Clinical Judgement Functional Limitation: Mobility: Walking and moving around Mobility: Walking and Moving Around Current Status [id number]): At least 20 percent but less than 40 percent impaired, limited or restricted Mobility: Walking and Moving Around Goal Status [phone]): At least 1 percent but less than 20 percent impaired, limited or restricted    Colletta [place] 07/03/2016, 10:47 AM  Roney [place], [place] Pager [phone] Office [phone]

## 2016-07-03 NOTE — Care Management Note (Addendum)
Case Management Note  Patient Details  Name: Brent Dunn MRN: [id number] Date of Birth: [birth year]  Subjective/Objective:           Patient presents with acute encephalopathy. Lives at home with brother, who is caregiver. CM will follow for discharge needs pending PT/OT evals and physician orders.          Action/Plan:   Expected Discharge Date:                  Expected Discharge Plan:     In-House Referral:     Discharge planning Services     Post Acute Care Choice:    Choice offered to:     DME Arranged:    DME Agency:     HH Arranged:    HH Agency:     Status of Service:     If discussed at [employer] of Stay Meetings, dates discussed:    Additional Comments:  Pt has needed DME at home.   Rolm Baptise, RN 07/03/2016, 11:50 AM

## 2016-07-03 NOTE — Progress Notes (Signed)
PROGRESS NOTE    Brent Dunn  [id number] DOB: 1935-09-18 DOA: 07/02/2016 PCP: Gara Kroner, MD   Outpatient Specialists:    Brief Narrative:   Brent Dunn is a 81 y.o. male with medical history significant of seizure disorder, depression, hyperlipidemia, hypertension, atrial fibrillation on Coumadin therapy, who presents to the hospital today due to altered mental status. Per report, patient has dementia at baseline, but is able to function in activities of daily living. Today however, family noticed that patient was not acting himself, more sleepy. Family is not at bedside and patient is unable to tell me his history, unable to reach family. Reports gathered from ED staff and physician notes. Patient is alert, oriented to self only. Denies any complaints and says "no" to all questions asked pertaining to ROS. Cannot tell me why he is in the hospital.     Assessment & Plan:   Principal Problem:   Acute encephalopathy Active Problems:   Atrial fibrillation (HCC)   Depression   Seizures (HCC)   Dementia   History of stroke   HLD (hyperlipidemia)   Diabetes mellitus, type 2 (HCC)   Acute encephalopathy due to worsening dementia -From previous neurology notes, patient has had history of memory loss, progressive dementia, does not typically carry a conversation, is not oriented. Last MMSE was 7/30.  -no sign of infection -unable to reach brother who is the caregiver -palliative care consult for [facility]  Atrial fibrillation -Continue Coumadin per pharmacy -subtherapeutic  Seizure disorder -Continue Keppra  Essential hypertension -Continue Norvasc, lisinopril  Diabetes mellitus type 2 -Hold metformin -Sliding-scale insulin  Hyperlipidemia -Continue Lipitor  Depression -Continue Lexapro   DVT prophylaxis:  Fully anticoagulated   Code Status: Full Code   Family Communication: Called brother-- sent to voicemail  Disposition Plan:  ?   Consultants:    Palliative care    Subjective: Will answer his name but not otherwise oriented-- eating breakfast while falling asleep  Objective: Vitals:   07/02/16 2312 07/03/16 0550 07/03/16 0900 07/03/16 1047  BP: (!) 144/87 139/70 120/70 127/75  Pulse: 66 (!) 42 (!) 46 77  Resp: 18 16 18 18   Temp: 97.7 F (36.5 C) 97.4 F (36.3 C) 97.8 F (36.6 C) 98 F (36.7 C)  TempSrc: Oral Oral Oral Oral  SpO2: 98% 98% 98% 100%  Height:        Intake/Output Summary (Last 24 hours) at 07/03/16 1122 Last data filed at 07/03/16 0700  Gross per 24 hour  Intake              120 ml  Output              295 ml  Net             -175 ml   There were no vitals filed for this visit.  Examination:  General exam: oriented to person only Respiratory system: Clear to auscultation. Respiratory effort normal. Cardiovascular system: iRR. No JVD, murmurs, rubs, gallops or clicks. No pedal edema. Gastrointestinal system: Abdomen is nondistended, soft and nontender. No organomegaly or masses felt. Normal bowel sounds heard. Central nervous system: sleepy- not able to participate in neuro eval   Data Reviewed: I have personally reviewed following labs and imaging studies  CBC:  Recent Labs Lab 07/02/16 1254 07/03/16 0436  WBC 5.6 4.7  NEUTROABS 4.0  --   HGB 11.8* 10.8*  HCT 37.0* 33.4*  MCV 94.4 92.0  PLT 198 [id number]   Basic Metabolic Panel:  Recent Labs  Lab 07/02/16 1254 07/03/16 0436  NA 143 143  K 4.2 3.8  CL 109 109  CO2 27 27  GLUCOSE 153* 90  BUN 19 13  CREATININE 1.24 0.99  CALCIUM 8.7* 8.5*   GFR: CrCl cannot be calculated (Unknown ideal weight.). Liver Function Tests:  Recent Labs Lab 07/02/16 1254  AST 22  ALT 16*  ALKPHOS 70  BILITOT 0.5  PROT 6.5  ALBUMIN 2.8*   No results for input(s): LIPASE, AMYLASE in the last 168 hours.  Recent Labs Lab 07/02/16 1252  AMMONIA 32   Coagulation Profile:  Recent Labs Lab 07/02/16 1254 07/03/16 0436  INR 1.69 1.88    Cardiac Enzymes:  Recent Labs Lab 07/02/16 1257  TROPONINI <0.03   BNP (last 3 results) No results for input(s): PROBNP in the last 8760 hours. HbA1C:  Recent Labs  07/02/16 1836  HGBA1C 6.7*   CBG:  Recent Labs Lab 07/02/16 1207 07/02/16 2123 07/03/16 0614  GLUCAP 160* 79 83   Lipid Profile: No results for input(s): CHOL, HDL, LDLCALC, TRIG, CHOLHDL, LDLDIRECT in the last 72 hours. Thyroid Function Tests: No results for input(s): TSH, T4TOTAL, FREET4, T3FREE, THYROIDAB in the last 72 hours. Anemia Panel: No results for input(s): VITAMINB12, FOLATE, FERRITIN, TIBC, IRON, RETICCTPCT in the last 72 hours. Urine analysis:    Component Value Date/Time   COLORURINE YELLOW 07/02/2016 1210   APPEARANCEUR CLOUDY (A) 07/02/2016 1210   LABSPEC 1.021 07/02/2016 1210   PHURINE 5.0 07/02/2016 1210   GLUCOSEU NEGATIVE 07/02/2016 1210   HGBUR NEGATIVE 07/02/2016 1210   BILIRUBINUR NEGATIVE 07/02/2016 1210   KETONESUR NEGATIVE 07/02/2016 1210   PROTEINUR NEGATIVE 07/02/2016 1210   UROBILINOGEN 1.0 07/23/2014 2009   NITRITE NEGATIVE 07/02/2016 1210   LEUKOCYTESUR NEGATIVE 07/02/2016 1210     )No results found for this or any previous visit (from the past 240 hour(s)).    Anti-infectives    None       Radiology Studies: Dg Chest 2 View  Result Date: 07/02/2016 CLINICAL DATA:  Altered mental status; h/o HTN and DM; former smoker EXAM: CHEST  2 VIEW COMPARISON:  07/23/2014 FINDINGS: Pacer with leads at right atrium and right ventricle. No lead discontinuity. Midline trachea. Mild cardiomegaly. Atherosclerosis in the transverse aorta. No pleural effusion or pneumothorax. No congestive failure. No lobar consolidation. IMPRESSION: No acute cardiopulmonary disease. Cardiomegaly without congestive failure. Aortic atherosclerosis. Electronically Signed   By: Abigail Miyamoto M.D.   On: 07/02/2016 13:23   Ct Head Wo Contrast  Result Date: 07/02/2016 CLINICAL DATA:  Altered  mental status and history of dementia. EXAM: CT HEAD WITHOUT CONTRAST TECHNIQUE: Contiguous axial images were obtained from the base of the skull through the vertex without intravenous contrast. COMPARISON:  02/12/2014 FINDINGS: Brain: Advanced cerebral atrophy. Encephalomalacia involving the left occipital lobe is consistent with remote infarct. Moderate low density in the periventricular white matter likely related to small vessel disease. There is also remote left cerebellar hemisphere infarct with resultant encephalomalacia. Smaller volume infarct in the right PCA distribution. Remote lacunar infarcts of the left basal ganglia. No mass lesion, hemorrhage, hydrocephalus, acute infarct, intra-axial, or extra-axial fluid collection. Vascular: Bilateral vertebral and carotid intracranial atherosclerosis. Skull: Normal Sinuses/Orbits: Normal orbits and globes. Mucosal thickening of sphenoid sinus. Clear mastoid air cells. Other: None IMPRESSION: 1.  No acute intracranial abnormality. 2.  Cerebral atrophy and small vessel ischemic change. 3. Remote infarcts involving the bilateral occipital lobes and left cerebellar hemisphere. 4. Sinus disease. Electronically Signed  By: Abigail Miyamoto M.D.   On: 07/02/2016 13:22        Scheduled Meds: . amLODipine  2.5 mg Oral Daily  . atorvastatin  40 mg Oral QHS  . escitalopram  10 mg Oral Daily  . folic acid  1 mg Oral Daily  . insulin aspart  0-15 Units Subcutaneous TID WC  . levETIRAcetam  500 mg Oral Q12H  . lisinopril  20 mg Oral Daily  . warfarin  7.5 mg Oral ONCE-1800  . Warfarin - Pharmacist Dosing Inpatient   Does not apply q1800   Continuous Infusions:   LOS: 0 days    Time spent: 25 min    [place], DO [facility] Pager [phone]  If 7PM-7AM, please contact night-coverage [email or web address] Password [email or web address] 07/03/2016, 11:22 AM

## 2016-07-03 NOTE — Progress Notes (Signed)
Palliative Medicine consult noted. Due to high referral volume, there may be a delay seeing this patient. Please call the Palliative Medicine Team office at [phone] if recommendations are needed in the interim.  Thank you for inviting [place] to see this patient.  Marjie Skiff Jeb Schloemer, RN, BSN, [facility] 07/03/2016 12:10 PM Cell [phone] 8:00-4:00 Monday-Friday Office [phone]

## 2016-07-04 DIAGNOSIS — F0390 Unspecified dementia without behavioral disturbance: Secondary | ICD-10-CM | POA: Diagnosis not present

## 2016-07-04 DIAGNOSIS — E118 Type 2 diabetes mellitus with unspecified complications: Secondary | ICD-10-CM | POA: Diagnosis not present

## 2016-07-04 DIAGNOSIS — I482 Chronic atrial fibrillation: Secondary | ICD-10-CM | POA: Diagnosis not present

## 2016-07-04 DIAGNOSIS — G9340 Encephalopathy, unspecified: Secondary | ICD-10-CM | POA: Diagnosis not present

## 2016-07-04 LAB — GLUCOSE, CAPILLARY
GLUCOSE-CAPILLARY: 82 mg/dL (ref 65–99)
GLUCOSE-CAPILLARY: 154 mg/dL — AB (ref 65–99)

## 2016-07-04 LAB — PROTIME-INR
INR: 1.85
Prothrombin Time: 21.6 seconds — ABNORMAL HIGH (ref 11.4–15.2)

## 2016-07-04 MED ORDER — METOPROLOL TARTRATE 25 MG PO TABS: 12.5000 mg | ORAL_TABLET | Freq: Two times a day (BID) | ORAL | 0 refills | Status: AC

## 2016-07-04 MED ORDER — WARFARIN SODIUM 6 MG PO TABS
9.0000 mg | ORAL_TABLET | Freq: Once | ORAL | Status: DC
  Filled 2016-07-04: qty 1

## 2016-07-04 NOTE — Discharge Summary (Signed)
Physician Discharge Summary  Brent Dunn [id number] DOB: April 05, 1936 DOA: 07/02/2016  PCP: Gara Kroner, MD  Admit date: 07/02/2016 Discharge date: 07/04/2016   Recommendations for Outpatient Follow-Up:   INR check 1 week Palliative care referral for [place]- family reasonable but with multiple members would be good idea to get MOST form filled out  Discharge Diagnosis:   Principal Problem:   Acute encephalopathy Active Problems:   Atrial fibrillation ([place])   Depression   Seizures ([place])   Dementia   History of stroke   HLD (hyperlipidemia)   Diabetes mellitus, type 2 ([place])   Discharge disposition:  Home with 24 hour care  Discharge Condition: Improved.  Diet recommendation: Low sodium, heart healthy  Wound care: None.   History of Present Illness:   Brent Dunn is a 80 y.o. male with medical history significant of seizure disorder, depression, hyperlipidemia, hypertension, atrial fibrillation on Coumadin therapy, who presents to the hospital today due to altered mental status. Per report, patient has dementia at baseline, but is able to function in activities of daily living. Today however, family noticed that patient was not acting himself, more sleepy. Family is not at bedside and patient is unable to tell me his history, unable to reach family. Reports gathered from ED staff and physician notes. Patient is alert, oriented to self only. Denies any complaints and says "no" to all questions asked pertaining to ROS. Cannot tell me why he is in the hospital.    Hospital Course by Problem:   Acute encephalopathy due to worsening dementia -From previous neurology notes, patient has had history of memory loss, progressive dementia, does not typically carry a conversation, is not oriented. Last MMSE was 7/30.  -no sign of infection -per brother, patient has been getting progressively worse over last years- discussed long term prognosis and expected complications of  dementia -palliative care consult for [place] as outpateint   Atrial fibrillation -Continue Coumadin per pharmacy -added low dose BB as HR 90-110s  Seizure disorder -Continue Keppra  Essential hypertension -Continue lisinopril -d/c norvasc  Diabetes mellitus type 2 -resume home meds  Hyperlipidemia -Continue Lipitor  Depression -Continue Lexapro    Medical Consultants:    None.   Discharge Exam:   Vitals:   07/04/16 0532 07/04/16 0932  BP: (!) 148/76 113/62  Pulse: 61 88  Resp: 16 16  Temp: 97.9 F (36.6 C) 97.6 F (36.4 C)   Vitals:   07/03/16 2138 07/04/16 0114 07/04/16 0532 07/04/16 0932  BP: 124/81 (!) 143/88 (!) 148/76 113/62  Pulse: 65 (!) 55 61 88  Resp: 16 18 16 16   Temp: 98.2 F (36.8 C) 97.7 F (36.5 C) 97.9 F (36.6 C) 97.6 F (36.4 C)  TempSrc: Oral Oral Oral Oral  SpO2: 100% 100% 99% 100%  Height:        Gen:  NAD- oriented to person only   The results of significant diagnostics from this hospitalization (including imaging, microbiology, ancillary and laboratory) are listed below for reference.     Procedures and Diagnostic Studies:   Dg Chest 2 View  Result Date: 07/02/2016 CLINICAL DATA:  Altered mental status; h/o HTN and DM; former smoker EXAM: CHEST  2 VIEW COMPARISON:  07/23/2014 FINDINGS: Pacer with leads at right atrium and right ventricle. No lead discontinuity. Midline trachea. Mild cardiomegaly. Atherosclerosis in the transverse aorta. No pleural effusion or pneumothorax. No congestive failure. No lobar consolidation. IMPRESSION: No acute cardiopulmonary disease. Cardiomegaly without congestive failure. Aortic atherosclerosis. Electronically Signed  By: Abigail Miyamoto M.D.   On: 07/02/2016 13:23   Ct Head Wo Contrast  Result Date: 07/02/2016 CLINICAL DATA:  Altered mental status and history of dementia. EXAM: CT HEAD WITHOUT CONTRAST TECHNIQUE: Contiguous axial images were obtained from the base of the skull through the  vertex without intravenous contrast. COMPARISON:  02/12/2014 FINDINGS: Brain: Advanced cerebral atrophy. Encephalomalacia involving the left occipital lobe is consistent with remote infarct. Moderate low density in the periventricular white matter likely related to small vessel disease. There is also remote left cerebellar hemisphere infarct with resultant encephalomalacia. Smaller volume infarct in the right PCA distribution. Remote lacunar infarcts of the left basal ganglia. No mass lesion, hemorrhage, hydrocephalus, acute infarct, intra-axial, or extra-axial fluid collection. Vascular: Bilateral vertebral and carotid intracranial atherosclerosis. Skull: Normal Sinuses/Orbits: Normal orbits and globes. Mucosal thickening of sphenoid sinus. Clear mastoid air cells. Other: None IMPRESSION: 1.  No acute intracranial abnormality. 2.  Cerebral atrophy and small vessel ischemic change. 3. Remote infarcts involving the bilateral occipital lobes and left cerebellar hemisphere. 4. Sinus disease. Electronically Signed   By: Abigail Miyamoto M.D.   On: 07/02/2016 13:22     Labs:   Basic Metabolic Panel:  Recent Labs Lab 07/02/16 1254 07/03/16 0436  NA 143 143  K 4.2 3.8  CL 109 109  CO2 27 27  GLUCOSE 153* 90  BUN 19 13  CREATININE 1.24 0.99  CALCIUM 8.7* 8.5*   GFR CrCl cannot be calculated (Unknown ideal weight.). Liver Function Tests:  Recent Labs Lab 07/02/16 1254  AST 22  ALT 16*  ALKPHOS 70  BILITOT 0.5  PROT 6.5  ALBUMIN 2.8*   No results for input(s): LIPASE, AMYLASE in the last 168 hours.  Recent Labs Lab 07/02/16 1252  AMMONIA 32   Coagulation profile  Recent Labs Lab 07/02/16 1254 07/03/16 0436 07/04/16 0228  INR 1.69 1.88 1.85    CBC:  Recent Labs Lab 07/02/16 1254 07/03/16 0436  WBC 5.6 4.7  NEUTROABS 4.0  --   HGB 11.8* 10.8*  HCT 37.0* 33.4*  MCV 94.4 92.0  PLT 198 172   Cardiac Enzymes:  Recent Labs Lab 07/02/16 1257  TROPONINI <0.03    BNP: Invalid input(s): POCBNP CBG:  Recent Labs Lab 07/03/16 1217 07/03/16 1610 07/03/16 2124 07/04/16 0628 07/04/16 1133  GLUCAP 191* 148* 72 82 154*   D-Dimer No results for input(s): DDIMER in the last 72 hours. Hgb A1c  Recent Labs  07/02/16 1836  HGBA1C 6.7*   Lipid Profile No results for input(s): CHOL, HDL, LDLCALC, TRIG, CHOLHDL, LDLDIRECT in the last 72 hours. Thyroid function studies No results for input(s): TSH, T4TOTAL, T3FREE, THYROIDAB in the last 72 hours.  Invalid input(s): FREET3 Anemia work up No results for input(s): VITAMINB12, FOLATE, FERRITIN, TIBC, IRON, RETICCTPCT in the last 72 hours. Microbiology No results found for this or any previous visit (from the past 240 hour(s)).   Discharge Instructions:   Discharge Instructions    Diet - low sodium heart healthy    Complete by:  As directed    Diet Carb Modified    Complete by:  As directed    Discharge instructions    Complete by:  As directed    24 hour care by family   Increase activity slowly    Complete by:  As directed      Allergies as of 07/04/2016   No Known Allergies     Medication List    STOP taking these medications  amLODipine 2.5 MG tablet Commonly known as:  NORVASC     TAKE these medications   atorvastatin 40 MG tablet Commonly known as:  LIPITOR Take 40 mg by mouth at bedtime.   escitalopram 10 MG tablet Commonly known as:  LEXAPRO Take 10 mg by mouth daily.   folic acid 1 MG tablet Commonly known as:  FOLVITE Take 1 mg by mouth daily.   levETIRAcetam 500 MG tablet Commonly known as:  KEPPRA Take 1 tablet (500 mg total) by mouth every 12 (twelve) hours.   lisinopril 20 MG tablet Commonly known as:  PRINIVIL,ZESTRIL Take 20 mg by mouth daily.   metformin 500 MG (OSM) 24 hr tablet Commonly known as:  FORTAMET Take 500 mg by mouth 2 (two) times daily with a meal.   metoprolol tartrate 25 MG tablet Commonly known as:  LOPRESSOR Take 0.5 tablets  (12.5 mg total) by mouth 2 (two) times daily.   warfarin 6 MG tablet Commonly known as:  COUMADIN Take 6 mg by mouth daily. Take as directed by the coumadin clinic      Follow-up Information    [facility], MD Follow up in 1 week(s).   Specialty:  Family Medicine Why:  palliative care referral Contact information: [place] [address] [phone]            Time coordinating discharge: 35 min  Signed:  Mark Hassey Alison Stalling   [facility] 07/04/2016, 1:15 PM

## 2016-07-04 NOTE — Progress Notes (Signed)
CSW consulted for possible SNF placement.  Pt from home with brother, Ronnie Doss, as primary caregiver.  CSW spoke with pt brother who feels like patient needs can be managed at home- Ronnie Doss works in the mornings but has an aid come in to care for patient while hes at work.  CSW updated RNCM.  No further CSW needs at this time  Jorge Ny, [place] Social Worker [phone]

## 2016-07-04 NOTE — Progress Notes (Signed)
ANTICOAGULATION CONSULT NOTE - FOLLOW UP  Pharmacy Consult:  Coumadin Indication: atrial fibrillation  No Known Allergies  Patient Measurements: Height: 5\' 8"  (172.7 cm) IBW/kg (Calculated) : 68.4  Vital Signs: Temp: 97.6 F (36.4 C) (01/16 0932) Temp Source: Oral (01/16 0932) BP: 113/62 (01/16 0932) Pulse Rate: 88 (01/16 0932)  Labs:  Recent Labs  07/02/16 1254 07/02/16 1257 07/03/16 0436 07/04/16 0228  HGB 11.8*  --  10.8*  --   HCT 37.0*  --  33.4*  --   PLT 198  --  172  --   LABPROT 20.1*  --  21.9* 21.6*  INR 1.69  --  1.88 1.85  CREATININE 1.24  --  0.99  --   TROPONINI  --  <0.03  --   --     CrCl cannot be calculated (Unknown ideal weight.).   Assessment: 52 YOM continues on Coumadin for history of Afib.  INR remains sub-therapeutic; no bleeding reported.   Goal of Therapy:  INR 2-3    Plan:  - Coumadin 9mg  PO today - Daily PT / INR   Taleya Whitcher D. Mina Marble, PharmD, BCPS Pager:  [phone] 07/04/2016, 10:10 AM

## 2016-07-04 NOTE — Progress Notes (Signed)
Palliative medicine note:   Consult received. Noted patient to be discharged today. Per Dr. Eliseo Squires, patient will be referred for outpatient Palliative consult.   Inpatient consult will be cancelled.  Brent Dunn, AGNP-C Palliative Medicine  Please call Palliative Medicine team phone with any questions [phone]. For individual providers please see AMION.

## 2016-07-04 NOTE — Care Management Note (Signed)
Case Management Note  Patient Details  Name: Brent Dunn MRN: [id number] Date of Birth: 1935-09-28  Subjective/Objective:                    Action/Plan: Pt discharging home with self care and his brother. Pt currently has needed DME at home and caregivers. No further needs per CM.   Expected Discharge Date:  07/04/16               Expected Discharge Plan:  Home/Self Care  In-House Referral:     Discharge planning Services     Post Acute Care Choice:    Choice offered to:     DME Arranged:    DME Agency:     HH Arranged:    HH Agency:     Status of Service:  Completed, signed off  If discussed at [employer] of Stay Meetings, dates discussed:    Additional Comments:  Pollie Friar, RN 07/04/2016, 1:14 PM

## 2016-07-04 NOTE — Progress Notes (Signed)
Patient's brother requested to speak with MD. MD paged to notify.

## 2017-01-16 ENCOUNTER — Other Ambulatory Visit: Payer: Self-pay | Admitting: Neurology

## 2017-02-16 ENCOUNTER — Telehealth: Payer: Self-pay | Admitting: Internal Medicine

## 2017-02-16 NOTE — Telephone Encounter (Signed)
°  New Prob   Brother has some questions regarding monitor/device. Please call.

## 2017-03-12 NOTE — Progress Notes (Deleted)
[facility]  PATIENT: Brent Dunn DOB: July 24, 1935   REASON FOR VISIT: *** HISTORY FROM:    HISTORY OF PRESENT ILLNESS:Brent Dunn is a 81 years old right-handed African American male, follow for stroke and seizure  He had a past medical history of diabetes, hypertension, depression, hyperlipidemia, atrial fibrillation, on chronic Coumadin treatment, also has pacemaker,he is a poor historian, his sister provides the majority of the history, he barely volunteer for information  He suffered a stroke in March 2014, was treated at [place], per referring notes, stroke involving left occipital, cerebellar region, he now moves down living with his brother, his 2 sisters checks on him every day,  Prior of stroke he lives alone at [place], but he cannot be left alone, confused, not following commands, gait difficulty, difficulty seeing his right side, difficulty reading, He had a seizure following his stroke, taking Keppra 500 twice a day, there was no recurrent seizures since April 2014 after he moved to [place]  He graduated from [employer] school, worked at a Designer, fashion/clothing job,  UPDATE Jul 10 2013: He came in with his sister today, he is living with his brother now, he ambulate without assistance, he can dress and feed himself, no recurrent seizure, he continues to have significant memory loss. He talked more than previous visit.  UPDATE Nov 9th 2015: He is with his brother Brent Dunn today, He is getting more confused, especially in morning time, sometimes wet himself, He has a Actuary daily. He is quiet, no seiuzre, he sits quietly most of time, he can dress, bath feed himself, talk occasionally. He is eating well, sleeping well.  He lives with his brother Brent Dunn, sometimes visiting with his sisters.  CT head in August 2015, No evidence of acute intracranial abnormality. Encephalomalacic changes related to prior infarcts. Atrophy with small vessel ischemic changes and  intracranial atherosclerosis. UPDATE Jan 11th 2016:He was started on Aricept since August 2015, he has no seizure, continue have slow progression memory trouble. Update 01/07/2015 BrentDunn, 81 year old male returns for follow-up with his brother. He has history of memory loss which has been progressive he also has a history of stroke involving the left occipital cerebellum region and poststroke seizures. He is currently on Keppra without any seizure activity since last seen. Memory is stable according to the brother he is on Coumadin for secondary stroke prevention and Aricept and Namenda for his memory loss without side effects. He returns for follow-up UPDATE 07/12/2015 Brent Dunn, 81 year old male returns for follow-up. He has a history stroke involving the left occipital cerebellum region and seizure disorder. He also has memory loss which has been progressive. He was on Namenda and Aricept when last seen however his Lenox Ponds has been stopped by his primary care. His Coumadin is checked every 3-4 weeks and has not had a recent dose change. He has not had recurrent stroke or TIA symptoms. He has not had further seizures. He is currently well-controlled on Keppra. He has in home help  every day while the brother works. Appetite is good he is sleeping well  UPDATE Sep 26th 2017: He is taking Keppra 500 mg twice a day, had no recurrent seizure, lives with his brother Brent Dunn, he is with some body all the times, he has good appeitied, sleeps well, quiet, barely carry a conversation   Aricept did not help, he is not taking it anymore, mild unsteady gait, frequent urinary incontinence, rarely bowel incontinence  ***  REVIEW OF SYSTEMS: Full 14 system review  of systems performed and notable only for those listed, all others are neg:  Constitutional: neg  Cardiovascular: neg Ear/Nose/Throat: neg  Skin: neg Eyes: neg Respiratory: neg Gastroitestinal: neg  Hematology/Lymphatic: neg  Endocrine:  neg Musculoskeletal:neg Allergy/Immunology: neg Neurological: neg Psychiatric: neg Sleep : neg   ALLERGIES: No Known Allergies  HOME MEDICATIONS: Outpatient Medications Prior to Visit  Medication Sig Dispense Refill  . atorvastatin (LIPITOR) 40 MG tablet Take 40 mg by mouth at bedtime.     Marland Kitchen escitalopram (LEXAPRO) 10 MG tablet Take 10 mg by mouth daily.    . folic acid (FOLVITE) 1 MG tablet Take 1 mg by mouth daily.    Marland Kitchen levETIRAcetam (KEPPRA) 500 MG tablet Take 1 tablet (500 mg total) by mouth every 12 (twelve) hours. 180 tablet 4  . lisinopril (PRINIVIL,ZESTRIL) 20 MG tablet Take 20 mg by mouth daily.    . metformin (FORTAMET) 500 MG (OSM) 24 hr tablet Take 500 mg by mouth 2 (two) times daily with a meal.     . metoprolol tartrate (LOPRESSOR) 25 MG tablet Take 0.5 tablets (12.5 mg total) by mouth 2 (two) times daily. 30 tablet 0  . warfarin (COUMADIN) 6 MG tablet Take 6 mg by mouth daily. Take as directed by the coumadin clinic     No facility-administered medications prior to visit.     PAST MEDICAL HISTORY: Past Medical History:  Diagnosis Date  . Atrial fibrillation ([place])   . Dementia   . Depression   . Diabetes mellitus type 2, controlled ([place])   . High blood pressure   . High cholesterol   . History of stroke   . Stroke [facility])     PAST SURGICAL HISTORY: Past Surgical History:  Procedure Laterality Date  . APPENDECTOMY    . EP IMPLANTABLE DEVICE N/A 04/27/2015   Procedure: PPM Generator Changeout;  Surgeon: Evans Lance, MD;  Location: [place] CV LAB;  Service: Cardiovascular;  Laterality: N/A;  . PACEMAKER INSERTION      FAMILY HISTORY: Family History  Problem Relation Age of Onset  . Stroke Father   . High blood pressure Father   . Stroke Mother   . Diabetes Sister   . Cancer Brother        colon    SOCIAL HISTORY: Social History   Social History  . Marital status: Single    Spouse name: N/A  . Number of children: 2  . Years of education:  12   Occupational History  .      Retired   Social History Main Topics  . Smoking status: Former Smoker    Types: Cigarettes  . Smokeless tobacco: Never Used     Comment: 1980's  . Alcohol use No  . Drug use: No  . Sexual activity: Not on file   Other Topics Concern  . Not on file   Social History Narrative   Patient  Lives at home with his brother  [name]) Patient is retired. Patient has a 12th grade education. Right handed.   Caffeine - None      Sister [name] - [phone]   Brent Dunn Brother- [phone]     PHYSICAL EXAM  There were no vitals filed for this visit. There is no height or weight on file to calculate BMI.  Generalized: Well developed, in no acute distress  Head: normocephalic and atraumatic,. Oropharynx benign  Neck: Supple, no carotid bruits  Cardiac: Regular rate rhythm, no murmur  Musculoskeletal: No deformity  Neurological examination   Mentation: Alert oriented to time, place, history taking. Attention span and concentration appropriate. Recent and remote memory intact.  Follows all commands speech and language fluent.   Cranial nerve II-XII: Fundoscopic exam reveals sharp disc margins.Pupils were equal round reactive to light extraocular movements were full, visual field were full on confrontational test. Facial sensation and strength were normal. hearing was intact to finger rubbing bilaterally. Uvula tongue midline. head turning and shoulder shrug were normal and symmetric.Tongue protrusion into cheek strength was normal. Motor: normal bulk and tone, full strength in the BUE, BLE, fine finger movements normal, no pronator drift. No focal weakness Sensory: normal and symmetric to light touch, pinprick, and  Vibration, proprioception  Coordination: finger-nose-finger, heel-to-shin bilaterally, no dysmetria Reflexes: Brachioradialis 2/2, biceps 2/2, triceps 2/2, patellar 2/2, Achilles 2/2, plantar responses were flexor bilaterally. Gait and  Station: Rising up from seated position without assistance, normal stance,  moderate stride, good arm swing, smooth turning, able to perform tiptoe, and heel walking without difficulty. Tandem gait is steady  DIAGNOSTIC DATA (LABS, IMAGING, TESTING) - I reviewed patient records, labs, notes, testing and imaging myself where available.  Lab Results  Component Value Date   WBC 4.7 07/03/2016   HGB 10.8 (L) 07/03/2016   HCT 33.4 (L) 07/03/2016   MCV 92.0 07/03/2016   PLT 172 07/03/2016      Component Value Date/Time   NA 143 07/03/2016 0436   K 3.8 07/03/2016 0436   CL 109 07/03/2016 0436   CO2 27 07/03/2016 0436   GLUCOSE 90 07/03/2016 0436   BUN 13 07/03/2016 0436   CREATININE 0.99 07/03/2016 0436   CREATININE 1.06 04/26/2015 1436   CALCIUM 8.5 (L) 07/03/2016 0436   PROT 6.5 07/02/2016 1254   ALBUMIN 2.8 (L) 07/02/2016 1254   AST 22 07/02/2016 1254   ALT 16 (L) 07/02/2016 1254   ALKPHOS 70 07/02/2016 1254   BILITOT 0.5 07/02/2016 1254   GFRNONAA >60 07/03/2016 0436   GFRAA >60 07/03/2016 0436   No results found for: CHOL, HDL, LDLCALC, LDLDIRECT, TRIG, CHOLHDL Lab Results  Component Value Date   HGBA1C 6.7 (H) 07/02/2016     ASSESSMENT AND PLAN 81 y.o. year old male History of stroke             Multiple vascular risk factors, atrial fibrillation, hypertension, hyperlipidemia, diabetes Advanced dementia Gait abnormality Atrial fibrillation             On chronic Coumadin treatment Urinary incontinence             Add on Vesicare 5 mg every night    Dennie Bible, [facility], [facility], APRN  [facility] [address] [place], [place] [address] [phone]

## 2017-03-13 ENCOUNTER — Ambulatory Visit: Payer: Medicare Other | Admitting: Nurse Practitioner

## 2017-03-14 ENCOUNTER — Encounter: Payer: Self-pay | Admitting: Nurse Practitioner

## 2017-04-11 NOTE — Progress Notes (Signed)
[facility]  PATIENT: Dominion Kathan DOB: 11-23-1935   REASON FOR VISIT: Follow-up for history of dementia and seizure disorder,history of stroke HISTORY FROM: patient and brother Don   HISTORY OF PRESENT ILLNESS:Mr. Lacomb is a 81 years old right-handed African American male, follow for stroke and seizure  He had a past medical history of diabetes, hypertension, depression, hyperlipidemia, atrial fibrillation, on chronic Coumadin treatment, also has pacemaker,he is a poor historian, his sister provides the majority of the history, he barely volunteer for information  He suffered a stroke in March 2014, was treated at [place], per referring notes, stroke involving left occipital, cerebellar region, he now moves down living with his brother, his 2 sisters checks on him every day,  Prior of stroke he lives alone at [place], but he cannot be left alone, confused, not following commands, gait difficulty, difficulty seeing his right side, difficulty reading, He had a seizure following his stroke, taking Keppra 500 twice a day, there was no recurrent seizures since April 2014 after he moved to [place]  He graduated from [employer] school, worked at a Designer, fashion/clothing job,  UPDATE Jul 10 2013: He came in with his sister today, he is living with his brother now, he ambulate without assistance, he can dress and feed himself, no recurrent seizure, he continues to have significant memory loss. He talked more than previous visit.  UPDATE Nov 9th 2015: He is with [name] today, He is getting more confused, especially in morning time, sometimes wet himself, He has a Actuary daily. He is quiet, no seiuzre, he sits quietly most of time, he can dress, bath feed himself, talk occasionally. He is eating well, sleeping well.  He lives with [name], sometimes visiting with his sisters.  CT head in August 2015, No evidence of acute intracranial abnormality.  Encephalomalacic changes related to prior infarcts. Atrophy with small vessel ischemic changes and intracranial atherosclerosis. UPDATE Jan 11th 2016:He was started on Aricept since August 2015, he has no seizure, continue have slow progression memory trouble. Update 01/07/2015 Mr.Chait, 81 year old male returns for follow-up with his brother. He has history of memory loss which has been progressive he also has a history of stroke involving the left occipital cerebellum region and poststroke seizures. He is currently on Keppra without any seizure activity since last seen. Memory is stable according to the brother he is on Coumadin for secondary stroke prevention and Aricept and Namenda for his memory loss without side effects. He returns for follow-up UPDATE 07/12/2015 [name], 81 year old male returns for follow-up. He has a history stroke involving the left occipital cerebellum region and seizure disorder. He also has memory loss which has been progressive. He was on Namenda and Aricept when last seen however his Len[place] Ponds has been stopped by his primary care. His Coumadin is checked every 3-4 weeks and has not had a recent dose change. He has not had recurrent stroke or TIA symptoms. He has not had further seizures. He is currently well-controlled on Keppra. He has in home help  every day while the brother works. Appetite is good he is sleeping well  UPDATE Sep 26th 2017:YY He is taking Keppra 500 mg twice a day, had no recurrent seizure, lives with his brother [name], he is with some body all the times, he has good appeitied, sleeps well, quiet, barely carry a conversation   Aricept did not help, he is not taking it anymore, mild unsteady gait, frequent urinary incontinence, rarely  bowel incontinence UPDATE 10/25/2018CM Mr. Ricci, 81 year old male returns for follow-up with his brotherDon. He has a history of stroke and seizure disorder and progressive memory loss. He has been on Namenda and Aricept  in the past but had been stopped due to side effects. He remains on Coumadin for secondary stroke prevention and atrial fib. This is checked every 2-3 weeks. He had an admission to [facility] for heart problems, the  brother says everything came out okay.He has not had any seizure activity, remains on Keppra without side effects. He had one fall and then was taken off pain medication without further falls. He remains at home with help every day,requires assistance with ADLs. Appetite is good and he sleeps well.No wandering behavior He has urinary and bowel incontinence. He returns for reevaluation Portions of the above document her copy from prior visit for review purposes only  REVIEW OF SYSTEMS: Full 14 system review of systems performed and notable only for those listed, all others are neg:  Constitutional: neg  Cardiovascular: neg Ear/Nose/Throat: neg  Skin: neg Eyes: neg Respiratory: neg Gastroitestinal: neg  Hematology/Lymphatic: neg  Endocrine: neg Musculoskeletal:gait abnormality Allergy/Immunology: neg Neurological: memory loss, seizure disorder Psychiatric: neg Sleep : neg   ALLERGIES: No Known Allergies  HOME MEDICATIONS: Outpatient Medications Prior to Visit  Medication Sig Dispense Refill  . atorvastatin (LIPITOR) 40 MG tablet Take 40 mg by mouth at bedtime.     Marland Kitchen escitalopram (LEXAPRO) 10 MG tablet Take 10 mg by mouth daily.    . folic acid (FOLVITE) 1 MG tablet Take 1 mg by mouth daily.    Marland Kitchen levETIRAcetam (KEPPRA) 500 MG tablet Take 1 tablet (500 mg total) by mouth every 12 (twelve) hours. 180 tablet 4  . lisinopril (PRINIVIL,ZESTRIL) 20 MG tablet Take 20 mg by mouth daily.    . metformin (FORTAMET) 500 MG (OSM) 24 hr tablet Take 500 mg by mouth 2 (two) times daily with a meal.     . metoprolol tartrate (LOPRESSOR) 25 MG tablet Take 0.5 tablets (12.5 mg total) by mouth 2 (two) times daily. 30 tablet 0  . warfarin (COUMADIN) 6 MG tablet Take 6 mg by mouth daily.  Take as directed by the coumadin clinic     No facility-administered medications prior to visit.     PAST MEDICAL HISTORY: Past Medical History:  Diagnosis Date  . Atrial fibrillation ([place])   . Dementia   . Depression   . Diabetes mellitus type 2, controlled ([place])   . High blood pressure   . High cholesterol   . History of stroke   . Stroke [facility])     PAST SURGICAL HISTORY: Past Surgical History:  Procedure Laterality Date  . APPENDECTOMY    . EP IMPLANTABLE DEVICE N/A 04/27/2015   Procedure: PPM Generator Changeout;  Surgeon: Evans Lance, MD;  Location: [place] CV LAB;  Service: Cardiovascular;  Laterality: N/A;  . PACEMAKER INSERTION      FAMILY HISTORY: Family History  Problem Relation Age of Onset  . Stroke Father   . High blood pressure Father   . Stroke Mother   . Diabetes Sister   . Cancer Brother        colon    SOCIAL HISTORY: Social History   Social History  . Marital status: Single    Spouse name: N/A  . Number of children: 2  . Years of education: 12   Occupational History  .      Retired  Social History Main Topics  . Smoking status: Former Smoker    Types: Cigarettes  . Smokeless tobacco: Never Used     Comment: 1980's  . Alcohol use No  . Drug use: No  . Sexual activity: Not on file   Other Topics Concern  . Not on file   Social History Narrative   Patient  Lives at home with his brother  [name]) Patient is retired. Patient has a 12th grade education. Right handed.   Caffeine - None      Sister [name] - [phone]   [name] Brother- [phone]     PHYSICAL EXAM  Vitals:   04/12/17 1524  BP: (!) 130/80   Pulse: 64  Weight: 177 lb 12.8 oz (80.6 kg)  Height: 5\' 8"  (1.727 m)   Body mass index is 27.03 kg/m.  Generalized: Well developed, in no acute distress  Head: normocephalic and atraumatic,. Oropharynx benign  Neck: Supple, no carotid bruits  Cardiac: Irregular rate rhythm, Musculoskeletal: No deformity     Neurological examination   Mentation: Alert minimal speech Follows some commands.AFT 0  MMSE - Mini Mental State Exam 04/12/2017 03/14/2016 06/29/2014  Orientation to time 0 1 3  Orientation to Place 0 0 2  Registration 2 3 3   Attention/ Calculation 0 0 0  Recall 0 0 0  Language- name 2 objects 1 1 2   Language- repeat 1 0 1  Language- follow 3 step command 3 2 1   Language- read & follow direction 0 0 0  Write a sentence 0 0 0  Copy design 0 0 0  Total score 7 7 12     Cranial nerve II-XII: Pupils were equal round reactive to light extraocular movements were full, right visual field cut. Facial sensation and strength were normal. hearing was intact to finger rubbing bilaterally. Uvula tongue midline. head turning and shoulder shrug were normal and symmetric.Tongue protrusion into cheek strength was normal. Motor: normal bulk and tone, right hemi-neglect Sensory: normal and symmetric to light touch, pinprick, Coordination: finger-nose-finger, heel-to-shin bilaterally, apraxia with use of his extremities ReflexesBrachioradialis 2/2, biceps 2/2, triceps 2/2, patellar 2/2, Achilles absent plantar responses were flexor bilaterally. Gait and Station: Rising up from seated position with assistance, ambulated short distance in the hall with standby assistance, cautious gait  DIAGNOSTIC DATA (LABS, IMAGING, TESTING) - I reviewed patient records, labs, notes, testing and imaging myself where available.  Lab Results  Component Value Date   WBC 4.7 07/03/2016   HGB 10.8 (L) 07/03/2016   HCT 33.4 (L) 07/03/2016   MCV 92.0 07/03/2016   PLT 172 07/03/2016      Component Value Date/Time   NA 143 07/03/2016 0436   K 3.8 07/03/2016 0436   CL 109 07/03/2016 0436   CO2 27 07/03/2016 0436   GLUCOSE 90 07/03/2016 0436   BUN 13 07/03/2016 0436   CREATININE 0.99 07/03/2016 0436   CREATININE 1.06 04/26/2015 1436   CALCIUM 8.5 (L) 07/03/2016 0436   PROT 6.5 07/02/2016 1254   ALBUMIN 2.8 (L)  07/02/2016 1254   AST 22 07/02/2016 1254   ALT 16 (L) 07/02/2016 1254   ALKPHOS 70 07/02/2016 1254   BILITOT 0.5 07/02/2016 1254   GFRNONAA >60 07/03/2016 0436   GFRAA >60 07/03/2016 0436    Lab Results  Component Value Date   HGBA1C 6.7 (H) 07/02/2016     ASSESSMENT AND PLAN 81 y.o. year old male with history of stroke multiple risk factors of atrial fibrillation, hyperlipidemia, hypertension, diabetes  and age.Patient has advanced dementia,gait abnormality on chronic Coumadin treatment, urinary and bowel incontinence  PLAN: Continue warfarin for secondary stroke prevention and atrial fibrillation Continue Lipitor Lopressor and Prinivil Advanced dementia , patient had side effects to Aricept and Namenda Be careful with ambulation at risk  for falls Continue Keppra 500 twice daily will refill Call for seizure activity Follow-up yearly and when necessary I spent 25 minutes in total face to face time with the patient more than 50% of which was spent counseling and coordination of care, reviewing test results reviewing medications and discussing and reviewing the diagnosis of advanced Alzheimer's and what to expect . Written information given. Dennie Bible, [facility], [facility], APRN  [facility] [address], [place], [place] [address] [phone]

## 2017-04-12 ENCOUNTER — Ambulatory Visit (INDEPENDENT_AMBULATORY_CARE_PROVIDER_SITE_OTHER): Payer: Medicare Other | Admitting: Nurse Practitioner

## 2017-04-12 ENCOUNTER — Encounter: Payer: Self-pay | Admitting: Nurse Practitioner

## 2017-04-12 VITALS — BP 130/80 | HR 64 | Ht 68.0 in | Wt 177.8 lb

## 2017-04-12 DIAGNOSIS — R569 Unspecified convulsions: Secondary | ICD-10-CM

## 2017-04-12 DIAGNOSIS — Z8673 Personal history of transient ischemic attack (TIA), and cerebral infarction without residual deficits: Secondary | ICD-10-CM

## 2017-04-12 DIAGNOSIS — F0390 Unspecified dementia without behavioral disturbance: Secondary | ICD-10-CM | POA: Diagnosis not present

## 2017-04-12 DIAGNOSIS — E785 Hyperlipidemia, unspecified: Secondary | ICD-10-CM

## 2017-04-12 MED ORDER — LEVETIRACETAM 500 MG PO TABS: 500.0000 mg | ORAL_TABLET | Freq: Two times a day (BID) | ORAL | 4 refills | Status: DC

## 2017-04-12 NOTE — Patient Instructions (Addendum)
Continue warfarin for secondary stroke prevention and atrial fibrillation Advanced dementia , patient had side effects to Aricept and Namenda Be careful with ambulation at risk  for falls Continue Keppra 500 twice daily will refill Call for seizure activity Follow-up yearly and when necessary Alzheimer Disease Alzheimer disease is a brain disease that affects memory, thinking, and behavior. People with Alzheimer disease lose mental abilities, and the disease gets worse over time. Survival with Alzheimer disease ranges from several years to as long as 20 years. What are the causes? This condition develops when a protein called beta-amyloid forms deposits in the brain. It is not known what causes these deposits to form. What increases the risk? This condition is more likely to develop in people who:  Are elderly.  Have a family history of dementia.  Have had a brain injury.  Have heart or blood vessel disease.  Have had a stroke.  Have high blood pressure or high cholesterol.  Have diabetes.  What are the signs or symptoms? Symptoms of this condition happen in three stages, which often overlap. Early stage In this stage, you may continue to be independent. You may still be able to drive, work, and be social. Symptoms in this stage include:  Minor memory problems, such as forgetting a name or what you read.  Difficulty with: ? Paying attention. ? Communicating. ? Doing familiar tasks. ? Learning new things.  Needing more time to do daily activities.  Anxiety.  Social withdrawal.  Loss of motivation.  Moderate stage In this stage, you will start to need care. This stage usually lasts the longest. Symptoms in this stage include:  Difficulty with expressing thoughts.  Memory loss that affects daily life. This can include forgetting: ? Your address or phone number. ? Events that have happened. ? Parts of your personal history, like where you went to  school.  Confusion about where you are or what time it is.  Difficulty in judging distance.  Changes in personality, mood, and behavior. You may be moody, irritable, angry, frustrated, fearful, anxious, or suspicious.  Poor reasoning and judgment.  Delusions or hallucinations.  Changes in sleep patterns.  Wandering and getting lost.  Severe stage In the final stage, you will need help with your personal care and dailyactivities. Symptoms in this stage include:  Worsening memory loss.  Personality changes.  Loss of awareness of your surroundings.  Changes in physical abilities, including the ability to walk, sit, and swallow.  Difficulty in communicating.  Inability to control the bladder and bowels.  Increasing confusion.  Increasing disruptive behavior.  How is this diagnosed? This condition is diagnosed with an assessment by your health care provider. During this assessment, your health care provider will talk with you and your family, friends, or caregivers about your symptoms. A thorough medical history will be taken, and you will have a physical exam and tests. Tests may include:  Lab tests, such as blood or urine tests.  Imaging tests, such as a CT scan, PET scan, or MRI.  A lumbar puncture. This test involves removing and testing a small amount of the fluid that surrounds the brain and spinal cord.  An electroencephalogram (EEG). In this test, small metal discs are used to measure electrical activity in the brain.  Memory tests, cognitive tests, and neuropsychological tests. These tests evaluate brain function.  How is this treated? At this time, there is no treatment to cure Alzheimer disease or stop it from getting worse. The goals of treatment  are:  To slow down the disease.  To manage behavioral problems.  To provide you with a safe environment.  To make life easier for you and your caregivers.  The following treatment options are  available:  Medicines. Medicines may help to slow down memory loss and control behavioral symptoms.  Talk therapy. Talk therapy provides you with education, support, and memory aids. It is most helpful in the early stages of the condition.  Counseling or spiritual guidance. It is normal to have a lot of feelings, including anger, relief, fear, and isolation. Counseling and guidance can help you deal with these feelings.  Caregiving. This involves having caregivers help you with your daily activities. Caregivers may be family members, friends, or trained medical professionals. Caregiving can be done at home or outside the home.  Family support groups. These provide education, emotional support, and information about community resources to family members who are taking care of you.  Follow these instructions at home: Medicines  Take over-the-counter and prescription medicines only as told by your health care provider.  Avoid taking medicines that can affect thinking, such as pain or sleeping medicines. Lifestyle   Make healthy lifestyle choices: ? Be physically active as told by your health care provider. ? Do not use any tobacco products, such as cigarettes, chewing tobacco, and e-cigarettes. If you need help quitting, ask your health care provider. ? Eat a healthy diet. ? Practice stress-management techniques when you get stressed. ? Stay social.  Drink enough fluid to keep your urine clear or pale yellow.  Make sure to get quality sleep. These tips can help you get a good night's rest: ? Avoid napping during the day. ? Keep your sleeping area dark and cool. ? Avoid exercising during the few hours before you go to bed. ? Avoid caffeine products in the evening. General instructions  Work with your health care provider to determine what you need help with and what your safety needs are.  If you were given a bracelet that tracks your location, make sure to wear it.  Keep all  follow-up visits as told by your health care provider. This is important.  If you have questions or would like additional support, you may contact The Alzheimer's Association: ? 24-hour helpline: 563-882-9667 ? Website: [employer] Contact a health care provider if:  You have nausea, vomiting, or trouble with eating.  You have dizziness, or weakness.  You have new or worsening trouble with sleeping.  You or your family members become concerned for your safety. Get help right away if:  You develop chest pain or difficulty with breathing.  You pass out. This information is not intended to replace advice given to you by your health care provider. Make sure you discuss any questions you have with your health care provider. Document Released: 02/15/2004 Document Revised: 02/04/2016 Document Reviewed: 03/03/2015 Elsevier Interactive Patient Education  2017 [employer].

## 2017-04-13 ENCOUNTER — Other Ambulatory Visit: Payer: Self-pay | Admitting: Neurology

## 2017-04-13 NOTE — Progress Notes (Signed)
I have reviewed and agreed above plan. 

## 2017-07-05 DIAGNOSIS — I4891 Unspecified atrial fibrillation: Secondary | ICD-10-CM | POA: Diagnosis not present

## 2017-07-05 DIAGNOSIS — R569 Unspecified convulsions: Secondary | ICD-10-CM | POA: Diagnosis not present

## 2017-07-05 DIAGNOSIS — E139 Other specified diabetes mellitus without complications: Secondary | ICD-10-CM | POA: Diagnosis not present

## 2017-07-05 DIAGNOSIS — I6789 Other cerebrovascular disease: Secondary | ICD-10-CM | POA: Diagnosis not present

## 2017-07-09 DIAGNOSIS — N3281 Overactive bladder: Secondary | ICD-10-CM | POA: Diagnosis not present

## 2017-07-09 DIAGNOSIS — I6930 Unspecified sequelae of cerebral infarction: Secondary | ICD-10-CM | POA: Diagnosis not present

## 2017-07-09 DIAGNOSIS — K5900 Constipation, unspecified: Secondary | ICD-10-CM | POA: Diagnosis not present

## 2017-07-09 DIAGNOSIS — R69 Illness, unspecified: Secondary | ICD-10-CM | POA: Diagnosis not present

## 2017-07-09 DIAGNOSIS — Z23 Encounter for immunization: Secondary | ICD-10-CM | POA: Diagnosis not present

## 2017-07-09 DIAGNOSIS — I4891 Unspecified atrial fibrillation: Secondary | ICD-10-CM | POA: Diagnosis not present

## 2017-07-09 DIAGNOSIS — I10 Essential (primary) hypertension: Secondary | ICD-10-CM | POA: Diagnosis not present

## 2017-07-09 DIAGNOSIS — E119 Type 2 diabetes mellitus without complications: Secondary | ICD-10-CM | POA: Diagnosis not present

## 2017-07-09 DIAGNOSIS — Z7901 Long term (current) use of anticoagulants: Secondary | ICD-10-CM | POA: Diagnosis not present

## 2017-07-09 DIAGNOSIS — E7800 Pure hypercholesterolemia, unspecified: Secondary | ICD-10-CM | POA: Diagnosis not present

## 2017-08-05 DIAGNOSIS — I4891 Unspecified atrial fibrillation: Secondary | ICD-10-CM | POA: Diagnosis not present

## 2017-08-05 DIAGNOSIS — I6789 Other cerebrovascular disease: Secondary | ICD-10-CM | POA: Diagnosis not present

## 2017-08-05 DIAGNOSIS — E139 Other specified diabetes mellitus without complications: Secondary | ICD-10-CM | POA: Diagnosis not present

## 2017-08-05 DIAGNOSIS — R569 Unspecified convulsions: Secondary | ICD-10-CM | POA: Diagnosis not present

## 2017-08-17 ENCOUNTER — Encounter: Payer: Self-pay | Admitting: *Deleted

## 2017-08-23 ENCOUNTER — Encounter: Payer: Self-pay | Admitting: Internal Medicine

## 2017-08-31 DIAGNOSIS — I4891 Unspecified atrial fibrillation: Secondary | ICD-10-CM | POA: Diagnosis not present

## 2017-08-31 DIAGNOSIS — Z7901 Long term (current) use of anticoagulants: Secondary | ICD-10-CM | POA: Diagnosis not present

## 2017-09-02 DIAGNOSIS — I6789 Other cerebrovascular disease: Secondary | ICD-10-CM | POA: Diagnosis not present

## 2017-09-02 DIAGNOSIS — R569 Unspecified convulsions: Secondary | ICD-10-CM | POA: Diagnosis not present

## 2017-09-02 DIAGNOSIS — I4891 Unspecified atrial fibrillation: Secondary | ICD-10-CM | POA: Diagnosis not present

## 2017-09-02 DIAGNOSIS — E139 Other specified diabetes mellitus without complications: Secondary | ICD-10-CM | POA: Diagnosis not present

## 2017-09-05 NOTE — Progress Notes (Signed)
Electrophysiology Office Note Date: 09/06/2017  ID:  Brent Dunn, DOB [birth year], MRN [id number]  PCP: Antony Contras, MD Electrophysiologist: Lovena Le  CC: Pacemaker follow-up  Brent Dunn is a 82 y.o. male seen today for Dr Lovena Le.  He has not been seen in our clinic since 2016.  He is seen with his brother today. He continues to struggle with memory issues. He sleeps most of the day. His appetite is good. He denies chest pain, palpitations, dyspnea, PND, orthopnea, nausea, vomiting, dizziness, syncope, edema, weight gain, or early satiety.  Device History: BSX single chamber PPM implanted 2006 for intermittent CHB; gen change 2016   Past Medical History:  Diagnosis Date  . Atrial fibrillation ([place])   . Dementia   . Depression   . Diabetes mellitus type 2, controlled ([place])   . High blood pressure   . High cholesterol   . History of stroke   . Stroke [facility])    Past Surgical History:  Procedure Laterality Date  . APPENDECTOMY    . EP IMPLANTABLE DEVICE N/A 04/27/2015   Procedure: PPM Generator Changeout;  Surgeon: Evans Lance, MD;  Location: [place] [facility];  Service: Cardiovascular;  Laterality: N/A;  . PACEMAKER INSERTION      Current Outpatient Medications  Medication Sig Dispense Refill  . atorvastatin (LIPITOR) 40 MG tablet Take 40 mg by mouth at bedtime.     Marland Kitchen escitalopram (LEXAPRO) 10 MG tablet Take 10 mg by mouth daily.    . folic acid (FOLVITE) 1 MG tablet Take 1 mg by mouth daily.    Marland Kitchen levETIRAcetam (KEPPRA) 500 MG tablet Take 1 tablet (500 mg total) by mouth every 12 (twelve) hours. 180 tablet 4  . lisinopril (PRINIVIL,ZESTRIL) 20 MG tablet Take 20 mg by mouth daily.    . metformin (FORTAMET) 500 MG (OSM) 24 hr tablet Take 500 mg by mouth 2 (two) times daily with a meal.     . metoprolol tartrate (LOPRESSOR) 25 MG tablet Take 0.5 tablets (12.5 mg total) by mouth 2 (two) times daily. 30 tablet 0  . nitroGLYCERIN (NITRODUR - DOSED IN MG/24 HR) 0.2 mg/hr  patch PLACE ONE PATCH ON SKIN DAILY  0  . warfarin (COUMADIN) 6 MG tablet Take 6 mg by mouth daily. Take as directed by the coumadin clinic     No current facility-administered medications for this visit.     Allergies:   Patient has no known allergies.   Social History: Social History   Socioeconomic History  . Marital status: Single    Spouse name: Not on file  . Number of children: 2  . Years of education: 67  . Highest education level: Not on file  Occupational History    Comment: Retired  Scientific laboratory technician  . Financial resource strain: Not on file  . Food insecurity:    Worry: Not on file    Inability: Not on file  . Transportation needs:    Medical: Not on file    Non-medical: Not on file  Tobacco Use  . Smoking status: Former Smoker    Types: Cigarettes  . Smokeless tobacco: Never Used  . Tobacco comment: 1980's  Substance and Sexual Activity  . Alcohol use: No    Alcohol/week: 0.0 oz  . Drug use: No  . Sexual activity: Not on file  Lifestyle  . Physical activity:    Days per week: Not on file    Minutes per session: Not on file  . Stress: Not  on file  Relationships  . Social connections:    Talks on phone: Not on file    Gets together: Not on file    Attends religious service: Not on file    Active member of club or organization: Not on file    Attends meetings of clubs or organizations: Not on file    Relationship status: Not on file  . Intimate partner violence:    Fear of current or ex partner: Not on file    Emotionally abused: Not on file    Physically abused: Not on file    Forced sexual activity: Not on file  Other Topics Concern  . Not on file  Social History Narrative   Patient  Lives at home with his brother  [name]) Patient is retired. Patient has a 12th grade education. Right handed.   Caffeine - None      Sister [name] - [phone]   [name] Brother- [phone]    Family History: Family History  Problem Relation Age of Onset  .  Stroke Father   . High blood pressure Father   . Stroke Mother   . Diabetes Sister   . Cancer Brother        colon     Review of Systems: All other systems reviewed and are otherwise negative except as noted above.   Physical Exam: VS:  BP 132/86   Pulse 87   Ht 5\' 8"  (1.727 m)   Wt 167 lb 12.8 oz (76.1 kg)   SpO2 97%   BMI 25.51 kg/m  , BMI Body mass index is 25.51 kg/m.  GEN- The patient is elderly appearing, alert and oriented x 3 today.   HEENT: normocephalic, atraumatic; sclera clear, conjunctiva pink; hearing intact; oropharynx clear; neck supple  Lungs- Clear to ausculation bilaterally, normal work of breathing.  No wheezes, rales, rhonchi Heart- Regular rate and rhythm  GI- soft, non-tender, non-distended, bowel sounds present  Extremities- no clubbing, cyanosis, or edema  MS- no significant deformity or atrophy Skin- warm and dry, no rash or lesion; PPM pocket well healed Psych- euthymic mood, full affect Neuro- strength and sensation are intact  PPM Interrogation- reviewed in detail today,  See PACEART report  EKG:  EKG is ordered today. The ekg ordered today shows AF with V pacing   Recent Labs: No results found for requested labs within last 8760 hours.   Wt Readings from Last 3 Encounters:  09/06/17 167 lb 12.8 oz (76.1 kg)  04/12/17 177 lb 12.8 oz (80.6 kg)  03/14/16 166 lb (75.3 kg)     Other studies Reviewed: Additional studies/ records that were reviewed today include: Dr Tanna Furry office notes  Assessment and Plan:  1.  Complete heart block  Normal PPM function See Pace Art report No changes today  2.  Permanent atrial fibrillation  Continue Warfarin INR followed by PCP  3.  HTN Stable No change required today   Current medicines are reviewed at length with the patient today.   The patient does not have concerns regarding his medicines.  The following changes were made today:  none  Labs/ tests ordered today include: none Orders  Placed This Encounter  Procedures  . EKG 12-Lead     Disposition:   Follow up with [facility], Dr Lovena Le 1 year     Signed, Chanetta Marshall, NP 09/06/2017 7:26 PM  [place] [address] [place] [address] [phone] (office) [phone] (fax)

## 2017-09-06 ENCOUNTER — Ambulatory Visit (INDEPENDENT_AMBULATORY_CARE_PROVIDER_SITE_OTHER): Payer: Medicare HMO | Admitting: Nurse Practitioner

## 2017-09-06 ENCOUNTER — Encounter: Payer: Self-pay | Admitting: Nurse Practitioner

## 2017-09-06 VITALS — BP 132/86 | HR 87 | Ht 68.0 in | Wt 167.8 lb

## 2017-09-06 DIAGNOSIS — I442 Atrioventricular block, complete: Secondary | ICD-10-CM | POA: Diagnosis not present

## 2017-09-06 DIAGNOSIS — I482 Chronic atrial fibrillation: Secondary | ICD-10-CM | POA: Diagnosis not present

## 2017-09-06 DIAGNOSIS — I4821 Permanent atrial fibrillation: Secondary | ICD-10-CM

## 2017-09-06 NOTE — Patient Instructions (Addendum)
Medication Instructions:   Your physician recommends that you continue on your current medications as directed. Please refer to the Current Medication list given to you today.   If you need a refill on your cardiac medications before your next appointment, please call your pharmacy.  Labwork: NONE ORDERED  TODAY    Testing/Procedures: NONE ORDERED  TODAY    Follow-Up:  Your physician wants you to follow-up in: [place] will receive a reminder letter in the mail two months in advance. If you don't receive a letter, please call our office to schedule the follow-up appointment.     Remote monitoring is used to monitor your Pacemaker of ICD from home. This monitoring reduces the number of office visits required to check your device to one time per year. It allows [place] to keep an eye on the functioning of your device to ensure it is working properly. You are scheduled for a device check from home. You may send your transmission at any time that day. If you have a wireless device, the transmission will be sent automatically. After your physician reviews your transmission, you will receive a postcard with your next transmission date.     Any Other Special Instructions Will Be Listed Below (If Applicable).

## 2017-09-10 LAB — CUP PACEART INCLINIC DEVICE CHECK
Date Time Interrogation Session: 20190325102154
MDC IDC LEAD IMPLANT DT: 20060302
MDC IDC LEAD LOCATION: 753860
MDC IDC LEAD SERIAL: 209135
MDC IDC PG IMPLANT DT: 20161108
MDC IDC PG SERIAL: 708065

## 2017-09-28 DIAGNOSIS — I4891 Unspecified atrial fibrillation: Secondary | ICD-10-CM | POA: Diagnosis not present

## 2017-09-28 DIAGNOSIS — Z7901 Long term (current) use of anticoagulants: Secondary | ICD-10-CM | POA: Diagnosis not present

## 2017-10-03 DIAGNOSIS — E139 Other specified diabetes mellitus without complications: Secondary | ICD-10-CM | POA: Diagnosis not present

## 2017-10-03 DIAGNOSIS — I6789 Other cerebrovascular disease: Secondary | ICD-10-CM | POA: Diagnosis not present

## 2017-10-03 DIAGNOSIS — I4891 Unspecified atrial fibrillation: Secondary | ICD-10-CM | POA: Diagnosis not present

## 2017-10-03 DIAGNOSIS — R569 Unspecified convulsions: Secondary | ICD-10-CM | POA: Diagnosis not present

## 2017-11-02 DIAGNOSIS — I6789 Other cerebrovascular disease: Secondary | ICD-10-CM | POA: Diagnosis not present

## 2017-11-02 DIAGNOSIS — R569 Unspecified convulsions: Secondary | ICD-10-CM | POA: Diagnosis not present

## 2017-11-02 DIAGNOSIS — I4891 Unspecified atrial fibrillation: Secondary | ICD-10-CM | POA: Diagnosis not present

## 2017-11-02 DIAGNOSIS — E139 Other specified diabetes mellitus without complications: Secondary | ICD-10-CM | POA: Diagnosis not present

## 2017-11-30 DIAGNOSIS — Z7901 Long term (current) use of anticoagulants: Secondary | ICD-10-CM | POA: Diagnosis not present

## 2017-11-30 DIAGNOSIS — I4891 Unspecified atrial fibrillation: Secondary | ICD-10-CM | POA: Diagnosis not present

## 2017-12-03 DIAGNOSIS — R569 Unspecified convulsions: Secondary | ICD-10-CM | POA: Diagnosis not present

## 2017-12-03 DIAGNOSIS — I6789 Other cerebrovascular disease: Secondary | ICD-10-CM | POA: Diagnosis not present

## 2017-12-03 DIAGNOSIS — I4891 Unspecified atrial fibrillation: Secondary | ICD-10-CM | POA: Diagnosis not present

## 2017-12-03 DIAGNOSIS — E139 Other specified diabetes mellitus without complications: Secondary | ICD-10-CM | POA: Diagnosis not present

## 2018-01-02 DIAGNOSIS — I6789 Other cerebrovascular disease: Secondary | ICD-10-CM | POA: Diagnosis not present

## 2018-01-02 DIAGNOSIS — R569 Unspecified convulsions: Secondary | ICD-10-CM | POA: Diagnosis not present

## 2018-01-02 DIAGNOSIS — E139 Other specified diabetes mellitus without complications: Secondary | ICD-10-CM | POA: Diagnosis not present

## 2018-01-02 DIAGNOSIS — I4891 Unspecified atrial fibrillation: Secondary | ICD-10-CM | POA: Diagnosis not present

## 2018-01-09 ENCOUNTER — Telehealth: Payer: Self-pay | Admitting: *Deleted

## 2018-01-09 NOTE — Telephone Encounter (Signed)
Attempted to reach patient or his brother to discuss Latitude monitor.  No answer, VM full.  Latitude monitor is not communicating with patient's device.  He will either need q3 month monitoring from home, or q6 month in-office checks.

## 2018-01-25 NOTE — Telephone Encounter (Signed)
Spoke with [name] (DPR). Attempted to troubleshoot home monitor but was unsuccessful. Advised I will reach out to [place], [employer], for further assistance. [name] is agreeable to this plan.

## 2018-01-28 ENCOUNTER — Ambulatory Visit (INDEPENDENT_AMBULATORY_CARE_PROVIDER_SITE_OTHER): Payer: Medicare HMO | Admitting: *Deleted

## 2018-01-28 DIAGNOSIS — I442 Atrioventricular block, complete: Secondary | ICD-10-CM | POA: Diagnosis not present

## 2018-01-29 ENCOUNTER — Encounter: Payer: Self-pay | Admitting: Cardiology

## 2018-01-29 NOTE — Telephone Encounter (Signed)
Transmission successfully received on 01/27/18. Next transmission on 04/29/18.

## 2018-01-29 NOTE — Progress Notes (Signed)
Remote pacemaker transmission.   

## 2018-01-31 DIAGNOSIS — Z7984 Long term (current) use of oral hypoglycemic drugs: Secondary | ICD-10-CM | POA: Diagnosis not present

## 2018-01-31 DIAGNOSIS — I6930 Unspecified sequelae of cerebral infarction: Secondary | ICD-10-CM | POA: Diagnosis not present

## 2018-01-31 DIAGNOSIS — Z7901 Long term (current) use of anticoagulants: Secondary | ICD-10-CM | POA: Diagnosis not present

## 2018-01-31 DIAGNOSIS — I10 Essential (primary) hypertension: Secondary | ICD-10-CM | POA: Diagnosis not present

## 2018-01-31 DIAGNOSIS — K5900 Constipation, unspecified: Secondary | ICD-10-CM | POA: Diagnosis not present

## 2018-01-31 DIAGNOSIS — N3281 Overactive bladder: Secondary | ICD-10-CM | POA: Diagnosis not present

## 2018-01-31 DIAGNOSIS — E7800 Pure hypercholesterolemia, unspecified: Secondary | ICD-10-CM | POA: Diagnosis not present

## 2018-01-31 DIAGNOSIS — I4891 Unspecified atrial fibrillation: Secondary | ICD-10-CM | POA: Diagnosis not present

## 2018-01-31 DIAGNOSIS — R69 Illness, unspecified: Secondary | ICD-10-CM | POA: Diagnosis not present

## 2018-01-31 DIAGNOSIS — E1169 Type 2 diabetes mellitus with other specified complication: Secondary | ICD-10-CM | POA: Diagnosis not present

## 2018-01-31 NOTE — Telephone Encounter (Signed)
Spoke with patient's brother to update him. Brent Dunn is appreciative of call and denies additional questions or concerns at this time.

## 2018-02-02 DIAGNOSIS — R569 Unspecified convulsions: Secondary | ICD-10-CM | POA: Diagnosis not present

## 2018-02-02 DIAGNOSIS — E139 Other specified diabetes mellitus without complications: Secondary | ICD-10-CM | POA: Diagnosis not present

## 2018-02-02 DIAGNOSIS — I4891 Unspecified atrial fibrillation: Secondary | ICD-10-CM | POA: Diagnosis not present

## 2018-02-02 DIAGNOSIS — I6789 Other cerebrovascular disease: Secondary | ICD-10-CM | POA: Diagnosis not present

## 2018-02-23 LAB — CUP PACEART REMOTE DEVICE CHECK
Date Time Interrogation Session: 20190907103545
Implantable Lead Implant Date: 20060302
Implantable Lead Serial Number: 209135
Implantable Pulse Generator Implant Date: 20161108
MDC IDC LEAD LOCATION: 753860
MDC IDC PG SERIAL: 708065

## 2018-03-05 DIAGNOSIS — R569 Unspecified convulsions: Secondary | ICD-10-CM | POA: Diagnosis not present

## 2018-03-05 DIAGNOSIS — E139 Other specified diabetes mellitus without complications: Secondary | ICD-10-CM | POA: Diagnosis not present

## 2018-03-05 DIAGNOSIS — I6789 Other cerebrovascular disease: Secondary | ICD-10-CM | POA: Diagnosis not present

## 2018-03-05 DIAGNOSIS — I4891 Unspecified atrial fibrillation: Secondary | ICD-10-CM | POA: Diagnosis not present

## 2018-04-04 DIAGNOSIS — I4891 Unspecified atrial fibrillation: Secondary | ICD-10-CM | POA: Diagnosis not present

## 2018-04-04 DIAGNOSIS — I6789 Other cerebrovascular disease: Secondary | ICD-10-CM | POA: Diagnosis not present

## 2018-04-04 DIAGNOSIS — E139 Other specified diabetes mellitus without complications: Secondary | ICD-10-CM | POA: Diagnosis not present

## 2018-04-04 DIAGNOSIS — R569 Unspecified convulsions: Secondary | ICD-10-CM | POA: Diagnosis not present

## 2018-04-15 NOTE — Progress Notes (Signed)
[facility]  PATIENT: Brent Dunn DOB: 11-23-1935   REASON FOR VISIT: Follow-up for history of dementia and seizure disorder,history of stroke HISTORY FROM: patient and brother Brent Dunn   HISTORY OF PRESENT ILLNESS:Brent Dunn is a 82 years old right-handed African American male, follow for stroke and seizure  He had a past medical history of diabetes, hypertension, depression, hyperlipidemia, atrial fibrillation, on chronic Coumadin treatment, also has pacemaker,he is a poor historian, his sister provides the majority of the history, he barely volunteer for information  He suffered a stroke in March 2014, was treated at [place], per referring notes, stroke involving left occipital, cerebellar region, he now moves down living with his brother, his 2 sisters checks on him every day,  Prior of stroke he lives alone at [place], but he cannot be left alone, confused, not following commands, gait difficulty, difficulty seeing his right side, difficulty reading, He had a seizure following his stroke, taking Keppra 500 twice a day, there was no recurrent seizures since April 2014 after he moved to [place]  He graduated from [employer] school, worked at a Designer, fashion/clothing job,  UPDATE Jul 10 2013: He came in with his sister today, he is living with his brother now, he ambulate without assistance, he can dress and feed himself, no recurrent seizure, he continues to have significant memory loss. He talked more than previous visit.  UPDATE Nov 9th 2015: He is with his brother Brent Dunn today, He is getting more confused, especially in morning time, sometimes wet himself, He has a Actuary daily. He is quiet, no seiuzre, he sits quietly most of time, he can dress, bath feed himself, talk occasionally. He is eating well, sleeping well.  He lives with his brother Brent Dunn, sometimes visiting with his sisters.  CT head in August 2015, No evidence of acute intracranial abnormality.  Encephalomalacic changes related to prior infarcts. Atrophy with small vessel ischemic changes and intracranial atherosclerosis. UPDATE Jan 11th 2016:He was started on Aricept since August 2015, he has no seizure, continue have slow progression memory trouble. Update 01/07/2015 BrentDunn, 82 year old male returns for follow-up with his brother. He has history of memory loss which has been progressive he also has a history of stroke involving the left occipital cerebellum region and poststroke seizures. He is currently on Keppra without any seizure activity since last seen. Memory is stable according to the brother he is on Coumadin for secondary stroke prevention and Aricept and Namenda for his memory loss without side effects. He returns for follow-up UPDATE 07/12/2015 Brent Dunn, 82 year old male returns for follow-up. He has a history stroke involving the left occipital cerebellum region and seizure disorder. He also has memory loss which has been progressive. He was on Namenda and Aricept when last seen however his [place] has been stopped by his primary care. His Coumadin is checked every 3-4 weeks and has not had a recent dose change. He has not had recurrent stroke or TIA symptoms. He has not had further seizures. He is currently well-controlled on Keppra. He has in home help  every day while the brother works. Appetite is good he is sleeping well  UPDATE Sep 26th 2017:YY He is taking Keppra 500 mg twice a day, had no recurrent seizure, lives with his brother Brent Dunn, he is with some body all the times, he has good appeitied, sleeps well, quiet, barely carry a conversation   Aricept did not help, he is not taking it anymore, mild unsteady gait, frequent urinary incontinence, rarely  bowel incontinence UPDATE 10/25/2018CM Brent Dunn, 82 year old male returns for follow-up with his brotherDon. He has a history of stroke and seizure disorder and progressive memory loss. He has been on Namenda and Aricept  in the past but had been stopped due to side effects. He remains on Coumadin for secondary stroke prevention and atrial fib. This is checked every 2-3 weeks. He had an admission to [facility] for heart problems, the  brother says everything came out okay.He has not had any seizure activity, remains on Keppra without side effects. He had one fall and then was taken off pain medication without further falls. He remains at home with help every day,requires assistance with ADLs. Appetite is good and he sleeps well.No wandering behavior He has urinary and bowel incontinence. He returns for reevaluation UPDATE 10/29/2019CMMr Dunn, 82 year old male returns for follow-up with his brother with whom he lives.  He has a history of stroke, seizure disorder and progressive memory loss.  Aricept and Namenda have been stopped due to side effects.  He remains on Coumadin for secondary stroke prevention and atrial fibrillation.  INR is checked by his primary care.  He remains on Keppra 500 mg twice daily for seizure disorder no seizures since last seen.  No recent falls appetite is good and he is sleeping well.  He continues to require assistance with ADLs.  No new neurologic changes.  He returns for reevaluation  REVIEW OF SYSTEMS: Full 14 system review of systems performed and notable only for those listed, all others are neg:  Constitutional: neg  Cardiovascular: neg Ear/Nose/Throat: neg  Skin: neg Eyes: neg Respiratory: neg Gastroitestinal: neg  Hematology/Lymphatic: Easy bruising Endocrine: neg Musculoskeletal:gait abnormality Allergy/Immunology: neg Neurological: memory loss, seizure disorder, history of stroke Psychiatric: neg Sleep : neg   ALLERGIES: No Known Allergies  HOME MEDICATIONS: Outpatient Medications Prior to Visit  Medication Sig Dispense Refill  . atorvastatin (LIPITOR) 40 MG tablet Take 40 mg by mouth at bedtime.     Marland Kitchen escitalopram (LEXAPRO) 10 MG tablet Take 10 mg by mouth  daily.    . folic acid (FOLVITE) 1 MG tablet Take 1 mg by mouth daily.    Marland Kitchen levETIRAcetam (KEPPRA) 500 MG tablet Take 1 tablet (500 mg total) by mouth every 12 (twelve) hours. 180 tablet 4  . lisinopril (PRINIVIL,ZESTRIL) 20 MG tablet Take 20 mg by mouth daily.    . metformin (FORTAMET) 500 MG (OSM) 24 hr tablet Take 500 mg by mouth 2 (two) times daily with a meal.     . metoprolol tartrate (LOPRESSOR) 25 MG tablet Take 0.5 tablets (12.5 mg total) by mouth 2 (two) times daily. 30 tablet 0  . nitroGLYCERIN (NITRODUR - DOSED IN MG/24 HR) 0.2 mg/hr patch PLACE ONE PATCH ON SKIN DAILY  0  . warfarin (COUMADIN) 6 MG tablet Take 6 mg by mouth daily. Take as directed by the coumadin clinic     No facility-administered medications prior to visit.     PAST MEDICAL HISTORY: Past Medical History:  Diagnosis Date  . Atrial fibrillation ([place])   . Dementia   . Depression   . Diabetes mellitus type 2, controlled ([place])   . High blood pressure   . High cholesterol   . History of stroke   . Stroke [facility])     PAST SURGICAL HISTORY: Past Surgical History:  Procedure Laterality Date  . APPENDECTOMY    . EP IMPLANTABLE DEVICE N/A 04/27/2015   Procedure: PPM Generator Changeout;  Surgeon: Carleene Overlie  Peyton Najjar, MD;  Location: [facility];  Service: Cardiovascular;  Laterality: N/A;  . PACEMAKER INSERTION      FAMILY HISTORY: Family History  Problem Relation Age of Onset  . Stroke Father   . High blood pressure Father   . Stroke Mother   . Diabetes Sister   . Cancer Brother        colon    SOCIAL HISTORY: Social History   Socioeconomic History  . Marital status: Single    Spouse name: Not on file  . Number of children: 2  . Years of education: 61  . Highest education level: Not on file  Occupational History    Comment: Retired  Scientific laboratory technician  . Financial resource strain: Not on file  . Food insecurity:    Worry: Not on file    Inability: Not on file  . Transportation needs:     Medical: Not on file    Non-medical: Not on file  Tobacco Use  . Smoking status: Former Smoker    Types: Cigarettes  . Smokeless tobacco: Never Used  . Tobacco comment: 1980's  Substance and Sexual Activity  . Alcohol use: No    Alcohol/week: 0.0 standard drinks  . Drug use: No  . Sexual activity: Not on file  Lifestyle  . Physical activity:    Days per week: Not on file    Minutes per session: Not on file  . Stress: Not on file  Relationships  . Social connections:    Talks on phone: Not on file    Gets together: Not on file    Attends religious service: Not on file    Active member of club or organization: Not on file    Attends meetings of clubs or organizations: Not on file    Relationship status: Not on file  . Intimate partner violence:    Fear of current or ex partner: Not on file    Emotionally abused: Not on file    Physically abused: Not on file    Forced sexual activity: Not on file  Other Topics Concern  . Not on file  Social History Narrative   Patient  Lives at home with his brother  Brent Dunn) Patient is retired. Patient has a 12th grade education. Right handed.   Caffeine - None      Sister [name] - [phone]   [name] Brother- [phone]     PHYSICAL EXAM  Vitals:   04/12/17 1524  BP: (!) 130/80   Pulse: 64  Weight: 177 lb 12.8 oz (80.6 kg)  Height: 5\' 8"  (1.727 m)   There is no height or weight on file to calculate BMI.  Generalized: Well developed, in no acute distress  Head: normocephalic and atraumatic,. Oropharynx benign  Neck: Supple, no carotid bruits  Cardiac: Irregular rate rhythm, Musculoskeletal: No deformity   Neurological examination   Mentation: Alert minimal speech Follows some commands.MMSE not repeated.  MMSE - Mini Mental State Exam 04/12/2017 03/14/2016 06/29/2014  Orientation to time 0 1 3  Orientation to Place 0 0 2  Registration 2 3 3   Attention/ Calculation 0 0 0  Recall 0 0 0  Language- name 2 objects 1 1 2     Language- repeat 1 0 1  Language- follow 3 step command 3 2 1   Language- read & follow direction 0 0 0  Write a sentence 0 0 0  Copy design 0 0 0  Total score 7 7 12  Cranial nerve II-XII: Pupils were equal round reactive to light extraocular movements were full, right visual field cut. Facial sensation and strength were normal. hearing was intact to finger rubbing bilaterally. Uvula tongue midline. head turning and shoulder shrug were normal and symmetric.Tongue protrusion into cheek strength was normal. Motor: normal bulk and tone, right hemi-neglect Sensory: normal and symmetric to light touch, pinprick,vibratory Coordination: finger-nose-finger, heel-to-shin bilaterally, apraxia with use of his extremities ReflexesBrachioradialis 2/2, biceps 2/2, triceps 2/2, patellar 2/2, Achilles absent plantar responses were flexor bilaterally. Gait and Station: In wheelchair not ambulated.  DIAGNOSTIC DATA (LABS, IMAGING, TESTING) - I reviewed patient records, labs, notes, testing and imaging myself where available.  Lab Results  Component Value Date   WBC 4.7 07/03/2016   HGB 10.8 (L) 07/03/2016   HCT 33.4 (L) 07/03/2016   MCV 92.0 07/03/2016   PLT 172 07/03/2016      Component Value Date/Time   NA 143 07/03/2016 0436   K 3.8 07/03/2016 0436   CL 109 07/03/2016 0436   CO2 27 07/03/2016 0436   GLUCOSE 90 07/03/2016 0436   BUN 13 07/03/2016 0436   CREATININE 0.99 07/03/2016 0436   CREATININE 1.06 04/26/2015 1436   CALCIUM 8.5 (L) 07/03/2016 0436   PROT 6.5 07/02/2016 1254   ALBUMIN 2.8 (L) 07/02/2016 1254   AST 22 07/02/2016 1254   ALT 16 (L) 07/02/2016 1254   ALKPHOS 70 07/02/2016 1254   BILITOT 0.5 07/02/2016 1254   GFRNONAA >60 07/03/2016 0436   GFRAA >60 07/03/2016 0436    Lab Results  Component Value Date   HGBA1C 6.7 (H) 07/02/2016     ASSESSMENT AND PLAN 82 y.o. year old male with history of stroke multiple risk factors of atrial fibrillation, hyperlipidemia,  hypertension, diabetes and age.Patient has advanced dementia,gait abnormality on chronic Coumadin treatment, urinary and bowel incontinence  PLAN: Continue warfarin for secondary stroke prevention and atrial fibrillation Continue Lipitor Lopressor and Prinivil Advanced dementia , patient had side effects to Aricept and Namenda Be careful with ambulation at risk  for falls Continue Keppra 500 twice daily will refill Call for seizure activity Follow-up yearly and when necessary I spent 25 minutes in total face to face time with the patient more than 50% of which was spent counseling and coordination of care, reviewing test results reviewing medications and discussing and reviewing the diagnosis of advanced Alzheimer's and what to expect .  No safety concerns identified. Portions of the above document her copy from prior visit for review purposes only Dennie Bible, [facility], [facility], [facility] [address] [place], [place] [address] [phone]

## 2018-04-16 ENCOUNTER — Encounter: Payer: Self-pay | Admitting: Nurse Practitioner

## 2018-04-16 ENCOUNTER — Ambulatory Visit: Payer: Medicare Other | Admitting: Nurse Practitioner

## 2018-04-16 VITALS — BP 152/89 | HR 81 | Ht 69.0 in | Wt 168.6 lb

## 2018-04-16 DIAGNOSIS — E785 Hyperlipidemia, unspecified: Secondary | ICD-10-CM | POA: Diagnosis not present

## 2018-04-16 DIAGNOSIS — Z8673 Personal history of transient ischemic attack (TIA), and cerebral infarction without residual deficits: Secondary | ICD-10-CM | POA: Diagnosis not present

## 2018-04-16 DIAGNOSIS — R69 Illness, unspecified: Secondary | ICD-10-CM | POA: Diagnosis not present

## 2018-04-16 DIAGNOSIS — I10 Essential (primary) hypertension: Secondary | ICD-10-CM | POA: Diagnosis not present

## 2018-04-16 DIAGNOSIS — R569 Unspecified convulsions: Secondary | ICD-10-CM | POA: Diagnosis not present

## 2018-04-16 DIAGNOSIS — F0390 Unspecified dementia without behavioral disturbance: Secondary | ICD-10-CM

## 2018-04-16 MED ORDER — LEVETIRACETAM 500 MG PO TABS: 500.0000 mg | ORAL_TABLET | Freq: Two times a day (BID) | ORAL | 3 refills | Status: AC

## 2018-04-16 NOTE — Patient Instructions (Signed)
Continue warfarin for secondary stroke prevention and atrial fibrillation Continue Lipitor Lopressor and Prinivil Advanced dementia , patient had side effects to Aricept and Namenda Be careful with ambulation at risk  for falls Continue Keppra 500 twice daily will refill Call for seizure activity Follow-up yearly and when necessary

## 2018-04-17 NOTE — Progress Notes (Signed)
I have reviewed and agreed above plan. 

## 2018-04-19 DIAGNOSIS — Z7901 Long term (current) use of anticoagulants: Secondary | ICD-10-CM | POA: Diagnosis not present

## 2018-04-19 DIAGNOSIS — Z23 Encounter for immunization: Secondary | ICD-10-CM | POA: Diagnosis not present

## 2018-04-19 DIAGNOSIS — I4891 Unspecified atrial fibrillation: Secondary | ICD-10-CM | POA: Diagnosis not present

## 2018-04-29 ENCOUNTER — Ambulatory Visit (INDEPENDENT_AMBULATORY_CARE_PROVIDER_SITE_OTHER): Payer: Medicare HMO | Admitting: *Deleted

## 2018-04-29 DIAGNOSIS — I442 Atrioventricular block, complete: Secondary | ICD-10-CM | POA: Diagnosis not present

## 2018-04-29 DIAGNOSIS — I4821 Permanent atrial fibrillation: Secondary | ICD-10-CM

## 2018-04-30 NOTE — Progress Notes (Signed)
Remote pacemaker transmission.   

## 2018-05-15 DIAGNOSIS — Z7901 Long term (current) use of anticoagulants: Secondary | ICD-10-CM | POA: Diagnosis not present

## 2018-05-15 DIAGNOSIS — I4891 Unspecified atrial fibrillation: Secondary | ICD-10-CM | POA: Diagnosis not present

## 2018-06-10 DIAGNOSIS — Z7901 Long term (current) use of anticoagulants: Secondary | ICD-10-CM | POA: Diagnosis not present

## 2018-06-10 DIAGNOSIS — I4891 Unspecified atrial fibrillation: Secondary | ICD-10-CM | POA: Diagnosis not present

## 2018-06-29 LAB — CUP PACEART REMOTE DEVICE CHECK
Date Time Interrogation Session: 20191111170600
Implantable Lead Serial Number: 209135
Implantable Pulse Generator Implant Date: 20161108
Lead Channel Impedance Value: 897 Ohm
Lead Channel Pacing Threshold Amplitude: 2.7 V
Lead Channel Setting Pacing Amplitude: 3.5 V
Lead Channel Setting Pacing Pulse Width: 0.4 ms
MDC IDC LEAD IMPLANT DT: 20060302
MDC IDC LEAD LOCATION: 753860
MDC IDC MSMT BATTERY REMAINING LONGEVITY: 78 mo
MDC IDC MSMT BATTERY REMAINING PERCENTAGE: 98 %
MDC IDC MSMT LEADCHNL RV PACING THRESHOLD PULSEWIDTH: 0.4 ms
MDC IDC SET LEADCHNL RV SENSING SENSITIVITY: 2.5 mV
MDC IDC STAT BRADY RV PERCENT PACED: 9 %
Pulse Gen Serial Number: 708065

## 2018-07-11 DIAGNOSIS — I4891 Unspecified atrial fibrillation: Secondary | ICD-10-CM | POA: Diagnosis not present

## 2018-07-11 DIAGNOSIS — Z7901 Long term (current) use of anticoagulants: Secondary | ICD-10-CM | POA: Diagnosis not present

## 2018-07-18 DIAGNOSIS — G301 Alzheimer's disease with late onset: Secondary | ICD-10-CM | POA: Diagnosis not present

## 2018-07-18 DIAGNOSIS — I517 Cardiomegaly: Secondary | ICD-10-CM | POA: Diagnosis not present

## 2018-07-18 DIAGNOSIS — Z66 Do not resuscitate: Secondary | ICD-10-CM | POA: Diagnosis not present

## 2018-07-18 DIAGNOSIS — I472 Ventricular tachycardia: Secondary | ICD-10-CM | POA: Diagnosis not present

## 2018-07-18 DIAGNOSIS — J984 Other disorders of lung: Secondary | ICD-10-CM | POA: Diagnosis not present

## 2018-07-18 DIAGNOSIS — I5023 Acute on chronic systolic (congestive) heart failure: Secondary | ICD-10-CM | POA: Diagnosis not present

## 2018-07-18 DIAGNOSIS — G9341 Metabolic encephalopathy: Secondary | ICD-10-CM | POA: Diagnosis not present

## 2018-07-18 DIAGNOSIS — I110 Hypertensive heart disease with heart failure: Secondary | ICD-10-CM | POA: Diagnosis not present

## 2018-07-18 DIAGNOSIS — Z7401 Bed confinement status: Secondary | ICD-10-CM | POA: Diagnosis not present

## 2018-07-18 DIAGNOSIS — E119 Type 2 diabetes mellitus without complications: Secondary | ICD-10-CM | POA: Diagnosis not present

## 2018-07-18 DIAGNOSIS — I4891 Unspecified atrial fibrillation: Secondary | ICD-10-CM | POA: Diagnosis not present

## 2018-07-18 DIAGNOSIS — R531 Weakness: Secondary | ICD-10-CM | POA: Diagnosis not present

## 2018-07-18 DIAGNOSIS — G40909 Epilepsy, unspecified, not intractable, without status epilepticus: Secondary | ICD-10-CM | POA: Diagnosis not present

## 2018-07-18 DIAGNOSIS — I442 Atrioventricular block, complete: Secondary | ICD-10-CM | POA: Diagnosis not present

## 2018-07-18 DIAGNOSIS — Z7189 Other specified counseling: Secondary | ICD-10-CM | POA: Diagnosis not present

## 2018-07-18 DIAGNOSIS — R5381 Other malaise: Secondary | ICD-10-CM | POA: Diagnosis not present

## 2018-07-18 DIAGNOSIS — J690 Pneumonitis due to inhalation of food and vomit: Secondary | ICD-10-CM | POA: Diagnosis not present

## 2018-07-18 DIAGNOSIS — E870 Hyperosmolality and hypernatremia: Secondary | ICD-10-CM | POA: Diagnosis not present

## 2018-07-18 DIAGNOSIS — R0989 Other specified symptoms and signs involving the circulatory and respiratory systems: Secondary | ICD-10-CM | POA: Diagnosis not present

## 2018-07-18 DIAGNOSIS — R4182 Altered mental status, unspecified: Secondary | ICD-10-CM | POA: Diagnosis not present

## 2018-07-18 DIAGNOSIS — Z515 Encounter for palliative care: Secondary | ICD-10-CM | POA: Diagnosis not present

## 2018-07-18 DIAGNOSIS — J189 Pneumonia, unspecified organism: Secondary | ICD-10-CM | POA: Diagnosis not present

## 2018-07-18 DIAGNOSIS — R69 Illness, unspecified: Secondary | ICD-10-CM | POA: Diagnosis not present

## 2018-07-18 DIAGNOSIS — I10 Essential (primary) hypertension: Secondary | ICD-10-CM | POA: Diagnosis not present

## 2018-07-18 DIAGNOSIS — Z8673 Personal history of transient ischemic attack (TIA), and cerebral infarction without residual deficits: Secondary | ICD-10-CM | POA: Diagnosis not present

## 2018-07-18 DIAGNOSIS — R1310 Dysphagia, unspecified: Secondary | ICD-10-CM | POA: Diagnosis not present

## 2018-07-18 DIAGNOSIS — M2550 Pain in unspecified joint: Secondary | ICD-10-CM | POA: Diagnosis not present

## 2018-07-18 DIAGNOSIS — Z950 Presence of cardiac pacemaker: Secondary | ICD-10-CM | POA: Diagnosis not present

## 2018-07-18 DIAGNOSIS — I482 Chronic atrial fibrillation, unspecified: Secondary | ICD-10-CM | POA: Diagnosis not present

## 2018-07-29 MED ORDER — SODIUM CHLORIDE 0.9 % IV SOLN
10.00 | INTRAVENOUS | Status: DC
Start: ? — End: 2018-07-29

## 2018-07-29 MED ORDER — WARFARIN SODIUM 7.5 MG PO TABS
7.50 | ORAL_TABLET | ORAL | Status: DC
Start: 2018-07-29 — End: 2018-07-29

## 2018-07-29 MED ORDER — ESCITALOPRAM OXALATE 10 MG PO TABS
10.00 | ORAL_TABLET | ORAL | Status: DC
Start: 2018-07-30 — End: 2018-07-29

## 2018-07-29 MED ORDER — GUAIFENESIN 100 MG/5ML PO LIQD
200.00 | ORAL | Status: DC
Start: ? — End: 2018-07-29

## 2018-07-29 MED ORDER — ACETAMINOPHEN 325 MG PO TABS
650.00 | ORAL_TABLET | ORAL | Status: DC
Start: ? — End: 2018-07-29

## 2018-07-29 MED ORDER — ALUM & MAG HYDROXIDE-SIMETH 200-200-20 MG/5ML PO SUSP
15.00 | ORAL | Status: DC
Start: ? — End: 2018-07-29

## 2018-07-29 MED ORDER — NITROGLYCERIN 0.4 MG SL SUBL
.40 | SUBLINGUAL_TABLET | SUBLINGUAL | Status: DC
Start: ? — End: 2018-07-29

## 2018-07-29 MED ORDER — ALBUTEROL SULFATE (2.5 MG/3ML) 0.083% IN NEBU
2.50 | INHALATION_SOLUTION | RESPIRATORY_TRACT | Status: DC
Start: ? — End: 2018-07-29

## 2018-07-29 MED ORDER — METOPROLOL TARTRATE 5 MG/5ML IV SOLN
5.00 | INTRAVENOUS | Status: DC
Start: ? — End: 2018-07-29

## 2018-07-29 MED ORDER — METOPROLOL TARTRATE 50 MG PO TABS
100.00 | ORAL_TABLET | ORAL | Status: DC
Start: 2018-07-29 — End: 2018-07-29

## 2018-07-29 MED ORDER — LISINOPRIL 20 MG PO TABS
20.00 | ORAL_TABLET | ORAL | Status: DC
Start: 2018-07-30 — End: 2018-07-29

## 2018-07-29 MED ORDER — GENERIC EXTERNAL MEDICATION
10.00 | Status: DC
Start: ? — End: 2018-07-29

## 2018-07-29 MED ORDER — ONDANSETRON HCL 4 MG PO TABS
4.00 | ORAL_TABLET | ORAL | Status: DC
Start: ? — End: 2018-07-29

## 2018-07-29 MED ORDER — POLYETHYLENE GLYCOL 3350 17 G PO PACK
17.00 | PACK | ORAL | Status: DC
Start: ? — End: 2018-07-29

## 2018-07-29 MED ORDER — AMIODARONE HCL 200 MG PO TABS
200.00 | ORAL_TABLET | ORAL | Status: DC
Start: 2018-07-29 — End: 2018-07-29

## 2018-07-29 MED ORDER — WARFARIN SODIUM 1 MG PO TABS
ORAL_TABLET | ORAL | Status: DC
Start: ? — End: 2018-07-29

## 2018-07-29 MED ORDER — ACETAMINOPHEN 650 MG RE SUPP
650.00 | RECTAL | Status: DC
Start: ? — End: 2018-07-29

## 2018-07-29 MED ORDER — FOLIC ACID 1 MG PO TABS
1.00 | ORAL_TABLET | ORAL | Status: DC
Start: 2018-07-30 — End: 2018-07-29

## 2018-07-29 MED ORDER — LABETALOL HCL 5 MG/ML IV SOLN
10.00 | INTRAVENOUS | Status: DC
Start: ? — End: 2018-07-29

## 2018-07-29 MED ORDER — LEVETIRACETAM 500 MG PO TABS
500.00 | ORAL_TABLET | ORAL | Status: DC
Start: 2018-07-29 — End: 2018-07-29

## 2018-07-29 MED ORDER — ATORVASTATIN CALCIUM 80 MG PO TABS
80.00 | ORAL_TABLET | ORAL | Status: DC
Start: 2018-07-29 — End: 2018-07-29

## 2018-08-02 DIAGNOSIS — I5023 Acute on chronic systolic (congestive) heart failure: Secondary | ICD-10-CM | POA: Diagnosis not present

## 2018-08-03 DIAGNOSIS — I5023 Acute on chronic systolic (congestive) heart failure: Secondary | ICD-10-CM | POA: Diagnosis not present

## 2018-08-12 DIAGNOSIS — Z8673 Personal history of transient ischemic attack (TIA), and cerebral infarction without residual deficits: Secondary | ICD-10-CM | POA: Diagnosis not present

## 2018-08-12 DIAGNOSIS — E119 Type 2 diabetes mellitus without complications: Secondary | ICD-10-CM | POA: Diagnosis not present

## 2018-08-12 DIAGNOSIS — G301 Alzheimer's disease with late onset: Secondary | ICD-10-CM | POA: Diagnosis not present

## 2018-08-12 DIAGNOSIS — I10 Essential (primary) hypertension: Secondary | ICD-10-CM | POA: Diagnosis not present

## 2018-08-12 DIAGNOSIS — E785 Hyperlipidemia, unspecified: Secondary | ICD-10-CM | POA: Diagnosis not present

## 2018-08-15 DIAGNOSIS — Z8673 Personal history of transient ischemic attack (TIA), and cerebral infarction without residual deficits: Secondary | ICD-10-CM | POA: Diagnosis not present

## 2018-08-16 ENCOUNTER — Encounter: Payer: Self-pay | Admitting: Cardiology

## 2018-09-18 DEATH — deceased

## 2018-10-24 ENCOUNTER — Telehealth: Payer: Self-pay | Admitting: Internal Medicine

## 2018-10-24 NOTE — Telephone Encounter (Signed)
New message   [facility] for pt to call and schedule virtual visit with DR. Lovena Le. Will offer doxemity if pt has smart phone.

## 2019-04-21 ENCOUNTER — Ambulatory Visit: Payer: Medicare HMO | Admitting: Neurology

## 2021-02-02 ENCOUNTER — Encounter: Payer: Self-pay | Admitting: Nurse Practitioner
